# Patient Record
Sex: Female | Born: 1975 | Race: White | Hispanic: No | Marital: Married | Smoking: Former smoker
Health system: Southern US, Community
[De-identification: ages and names within clinical notes are randomized; demographics above are authoritative.]

## PROBLEM LIST (undated history)

## (undated) DIAGNOSIS — IMO0002 Reserved for concepts with insufficient information to code with codable children: Secondary | ICD-10-CM

## (undated) DIAGNOSIS — R87619 Unspecified abnormal cytological findings in specimens from cervix uteri: Secondary | ICD-10-CM

## (undated) DIAGNOSIS — F319 Bipolar disorder, unspecified: Secondary | ICD-10-CM

## (undated) DIAGNOSIS — D569 Thalassemia, unspecified: Secondary | ICD-10-CM

## (undated) HISTORY — PX: CRYOTHERAPY: SHX1416

## (undated) HISTORY — DX: Unspecified abnormal cytological findings in specimens from cervix uteri: R87.619

## (undated) HISTORY — PX: COLPOSCOPY: SHX161

## (undated) HISTORY — DX: Bipolar disorder, unspecified: F31.9

## (undated) HISTORY — PX: DILATION AND CURETTAGE OF UTERUS: SHX78

## (undated) HISTORY — DX: Reserved for concepts with insufficient information to code with codable children: IMO0002

## (undated) HISTORY — DX: Thalassemia, unspecified: D56.9

---

## 1998-08-06 ENCOUNTER — Emergency Department (HOSPITAL_COMMUNITY): Admission: EM | Admit: 1998-08-06 | Discharge: 1998-08-06 | Payer: Self-pay | Admitting: Emergency Medicine

## 1999-09-25 ENCOUNTER — Other Ambulatory Visit: Admission: RE | Admit: 1999-09-25 | Discharge: 1999-09-25 | Payer: Self-pay | Admitting: Obstetrics and Gynecology

## 2000-03-28 ENCOUNTER — Emergency Department (HOSPITAL_COMMUNITY): Admission: EM | Admit: 2000-03-28 | Discharge: 2000-03-29 | Payer: Self-pay | Admitting: Emergency Medicine

## 2000-03-29 ENCOUNTER — Encounter: Payer: Self-pay | Admitting: Emergency Medicine

## 2000-10-06 ENCOUNTER — Other Ambulatory Visit: Admission: RE | Admit: 2000-10-06 | Discharge: 2000-10-06 | Payer: Self-pay | Admitting: Obstetrics and Gynecology

## 2000-11-03 ENCOUNTER — Emergency Department (HOSPITAL_COMMUNITY): Admission: EM | Admit: 2000-11-03 | Discharge: 2000-11-04 | Payer: Self-pay | Admitting: Emergency Medicine

## 2000-11-04 ENCOUNTER — Encounter: Payer: Self-pay | Admitting: Emergency Medicine

## 2000-12-21 ENCOUNTER — Encounter: Payer: Self-pay | Admitting: Gynecology

## 2000-12-21 ENCOUNTER — Inpatient Hospital Stay: Admission: AD | Admit: 2000-12-21 | Discharge: 2000-12-21 | Payer: Self-pay | Admitting: Gynecology

## 2000-12-23 ENCOUNTER — Encounter (INDEPENDENT_AMBULATORY_CARE_PROVIDER_SITE_OTHER): Payer: Self-pay | Admitting: Specialist

## 2000-12-23 ENCOUNTER — Ambulatory Visit (HOSPITAL_COMMUNITY): Admission: RE | Admit: 2000-12-23 | Discharge: 2000-12-23 | Payer: Self-pay | Admitting: *Deleted

## 2003-03-30 ENCOUNTER — Emergency Department (HOSPITAL_COMMUNITY): Admission: EM | Admit: 2003-03-30 | Discharge: 2003-03-31 | Payer: Self-pay | Admitting: Emergency Medicine

## 2003-04-04 ENCOUNTER — Ambulatory Visit (HOSPITAL_COMMUNITY): Admission: RE | Admit: 2003-04-04 | Discharge: 2003-04-04 | Payer: Self-pay | Admitting: Cardiology

## 2004-03-11 ENCOUNTER — Ambulatory Visit (HOSPITAL_COMMUNITY): Admission: RE | Admit: 2004-03-11 | Discharge: 2004-03-11 | Payer: Self-pay | Admitting: Family Medicine

## 2004-03-15 ENCOUNTER — Emergency Department (HOSPITAL_COMMUNITY): Admission: EM | Admit: 2004-03-15 | Discharge: 2004-03-15 | Payer: Self-pay | Admitting: Emergency Medicine

## 2004-03-18 ENCOUNTER — Emergency Department (HOSPITAL_COMMUNITY): Admission: EM | Admit: 2004-03-18 | Discharge: 2004-03-19 | Payer: Self-pay | Admitting: Emergency Medicine

## 2004-03-20 ENCOUNTER — Encounter: Admission: RE | Admit: 2004-03-20 | Discharge: 2004-03-20 | Payer: Self-pay | Admitting: Diagnostic Radiology

## 2004-03-30 ENCOUNTER — Ambulatory Visit (HOSPITAL_COMMUNITY): Admission: RE | Admit: 2004-03-30 | Discharge: 2004-03-30 | Payer: Self-pay | Admitting: Neurosurgery

## 2005-05-15 ENCOUNTER — Emergency Department (HOSPITAL_COMMUNITY): Admission: EM | Admit: 2005-05-15 | Discharge: 2005-05-15 | Payer: Self-pay | Admitting: Emergency Medicine

## 2005-10-24 ENCOUNTER — Emergency Department (HOSPITAL_COMMUNITY): Admission: EM | Admit: 2005-10-24 | Discharge: 2005-10-24 | Payer: Self-pay | Admitting: Emergency Medicine

## 2006-03-08 ENCOUNTER — Encounter: Admission: RE | Admit: 2006-03-08 | Discharge: 2006-03-08 | Payer: Self-pay | Admitting: Family Medicine

## 2006-05-16 ENCOUNTER — Ambulatory Visit: Payer: Self-pay | Admitting: Gynecology

## 2006-05-16 ENCOUNTER — Encounter (INDEPENDENT_AMBULATORY_CARE_PROVIDER_SITE_OTHER): Payer: Self-pay | Admitting: Specialist

## 2007-05-17 ENCOUNTER — Encounter (INDEPENDENT_AMBULATORY_CARE_PROVIDER_SITE_OTHER): Payer: Self-pay | Admitting: Gynecology

## 2007-05-17 ENCOUNTER — Ambulatory Visit: Payer: Self-pay | Admitting: Gynecology

## 2008-05-20 ENCOUNTER — Encounter: Payer: Self-pay | Admitting: Obstetrics and Gynecology

## 2008-05-20 ENCOUNTER — Ambulatory Visit: Payer: Self-pay | Admitting: Obstetrics and Gynecology

## 2008-05-21 ENCOUNTER — Encounter: Payer: Self-pay | Admitting: Family Medicine

## 2008-05-21 LAB — CONVERTED CEMR LAB
Trich, Wet Prep: NONE SEEN
Yeast Wet Prep HPF POC: NONE SEEN

## 2008-07-03 ENCOUNTER — Ambulatory Visit: Payer: Self-pay | Admitting: Obstetrics & Gynecology

## 2008-07-11 ENCOUNTER — Encounter: Admission: RE | Admit: 2008-07-11 | Discharge: 2008-07-11 | Payer: Self-pay | Admitting: Family Medicine

## 2008-07-18 ENCOUNTER — Ambulatory Visit: Payer: Self-pay | Admitting: Obstetrics and Gynecology

## 2008-08-01 ENCOUNTER — Ambulatory Visit: Payer: Self-pay | Admitting: Obstetrics and Gynecology

## 2008-10-08 ENCOUNTER — Ambulatory Visit: Payer: Self-pay | Admitting: Obstetrics & Gynecology

## 2009-02-17 ENCOUNTER — Ambulatory Visit: Payer: Self-pay | Admitting: Family Medicine

## 2009-07-07 ENCOUNTER — Ambulatory Visit: Payer: Self-pay | Admitting: Obstetrics and Gynecology

## 2009-12-19 ENCOUNTER — Encounter: Admission: RE | Admit: 2009-12-19 | Discharge: 2009-12-19 | Payer: Self-pay | Admitting: Family Medicine

## 2010-03-15 ENCOUNTER — Encounter: Payer: Self-pay | Admitting: Family Medicine

## 2010-07-07 NOTE — Assessment & Plan Note (Signed)
NAMELESBIA, OTTAWAY NO.:  [id number]   MEDICAL RECORD NO.:  [id number]          PATIENT TYPE:  POB   LOCATION:  [facility] at [facility]         FACILITY:  [facility]   PHYSICIAN:  Argentina Donovan, MD        DATE OF BIRTH:  09/19/1975   DATE OF SERVICE:  05/20/2008                                  CLINIC NOTE   HISTORY OF PRESENT ILLNESS:  The patient is a 35 year old Caucasian  female gravida 2, para 1-0-1-1 with a severe depression and multiple  psychotomimetic drugs over the past month.  She has been on [facility]  for some time and is very satisfied with it.  We will give her  prescription for that.  She is in for annual physical examination.   MEDICATIONS:  Medications she is on are listed in the chart.   ALLERGIES:  Allergies to SEPTRA   REVIEW OF SYSTEMS:  Negative with the exception of the severe depression  and a complaint of heavy whitish foul vaginal discharge for the past  month.   PHYSICAL EXAMINATION:  VITAL SIGNS:  The patient's blood pressure is  128/97 and her pulse is 94.  She weighs 222 pounds.  She is 5 feet 7  inches tall  GENERAL:  Well developed slightly obese white female, in no acute  distress.  HEENT:  Within normal limits.  LUNGS:  Clear to auscultation and percussion.  HEART:  No murmur.  Normal sinus rhythm.  NECK:  Supple.  Thyroid symmetrical with no masses.  BREASTS:  Symmetrical.  No dominant masses.  No nipple discharge.  No  supraclavicular or axillary nodes.  ABDOMEN:  Soft, flat, and nontender.  No masses.  No organomegaly.  No  CVA tenderness.  EXTREMITIES:  No edema.  No varicosities.  NEUROLOGIC:  DTRs within normal limits.  PELVIC :  Vagina, external genitalia is normal.  Introitus is marital.  BUS is within normal limits.  Vagina is clean and well rugated.  Cervix  clean, parous, and well epithelialized.  The uterus is anterior with  normal size, shape, and consistency.  The adnexa could not be palpated  because of habitus  of the patient.   IMPRESSION:  Normal gynecological examination.  Wet prep was taken  because of the patient's complain of the foul heavy white discharge and  Pap smear is done.  The patient will be contacted with the results.           ______________________________  Argentina Donovan, MD     PR/MEDQ  D:  05/20/2008  T:  05/21/2008  Job:  [id number]

## 2010-07-07 NOTE — Assessment & Plan Note (Signed)
Samantha Jennings, Samantha Jennings NO.:  [id number]   MEDICAL RECORD NO.:  [id number]          PATIENT TYPE:  POB   LOCATION:  [facility] at [facility]         FACILITY:  [facility]   PHYSICIAN:  Argentina Donovan, MD        DATE OF BIRTH:  1975-10-01   DATE OF SERVICE:  07/18/2008                                  CLINIC NOTE   The patient is a 35 year old, gravida 2, para 1-0-1-1, who had a Pap  smear in March with LSIL underwent colposcopy in Jul 03, 2008 which  revealed benign endocervical curettings with biopsies of the exocervix  showing 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  The patient is a nonsmoker and  therefore I feel that probably cryosurgery of the cervix may very well  be enough to refer her cervix back to normal.  I am going to get her  scheduled over [facility] to get that done in the near  future.  She has been told since she is on birth control pills that if  she has a period was scheduled to reschedule.  I have talked to her  about the possible complications of the procedure.  She is going to  premedicate herself with ibuprofen prior to coming in for the procedure.  The patient is allergic to Septra, but has no other medical allergies.  She takes several cycle of medic drop such as Depakote, Lamictal Ambien,  and has been on season ache for sometime.  Review of systems in the past  has been negative with exception of severe depression and physical  examination was done in the last visit was completely normal with the  exception of the cervix.   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   PLAN:  Cryosurgery.           ______________________________  Argentina Donovan, MD     PR/MEDQ  D:  07/18/2008  T:  07/19/2008  Job:  [id number]

## 2010-07-07 NOTE — Assessment & Plan Note (Signed)
NAME:  Samantha Jennings, MEINTS NO.:  [id number]   MEDICAL RECORD NO.:  [id number]          PATIENT TYPE:  POB   LOCATION:  [facility] at [facility]         FACILITY:  [facility]   PHYSICIAN:  Scheryl Darter, MD       DATE OF BIRTH:  [birth year]   DATE OF SERVICE:                                  CLINIC NOTE   The patient returns today to consult about her birth control pills.  She  has a 35 year old white female, gravida 2, para 1, abortus 1, last  menstrual period Jul 21, 2008, who was on [place] for contraception.  She has been on this for a while, and she has been on birth control  pills since delivery of her child 12 years ago.  Her psychiatrist has  suggested that treatment for bipolar disorder may make her birth control  pills ineffective.  She is currently taking Abilify 15 mg day,  Wellbutrin XL 300 mg a day, and Lamictal 200 mg a day.  She had  cryotherapy for CIN 2 in June.   She has an allergy to [facility] [place].   The patient is pleasant, has normal affect today.  We discussed issues  of management of her bipolar disorder versus maintaining effective birth  control and cycle control.  She has had effective contraception for 12  years on oral contraceptives despite being treated for bipolar disorder  over the years and she is having excellent cycle control.  I believe she  has exhibited good effectiveness of her current meds and would not like  to change her method unless she decides she wants to.  We did discuss  the possibility of using an IUD instead of birth control pills.  She is  reassured by our discussion and wants to continue with oral  contraceptive pills.  She is to return in 4 months for repeat Pap smear.      Scheryl Darter, MD     JA/MEDQ  D:  10/08/2008  T:  10/09/2008  Job:  [id number]

## 2010-07-10 NOTE — Op Note (Signed)
[facility]  Patient:    DEBORRAH, MABIN Visit Number: [id number] MRN: [id number]          Service Type: DSU Location: [facility] Attending Physician:  Wetzel Bjornstad Proc. Date: 12/23/00 Admit Date:  12/23/2000 Discharge Date: 12/23/2000                             Operative Report  PREOPERATIVE DIAGNOSIS:       11-week missed abortion.  POSTOPERATIVE DIAGNOSIS:      11-week missed abortion.  OPERATION:                    Suction dilatation and curettage.  SURGEON:                      Katy Fitch, M.D.  ASSISTANT:  ANESTHESIA:                   MAC.  ESTIMATED BLOOD LOSS:         50 cc.  URINE OUTPUT:                 50 cc.  FLUIDS:                       800 cc crystalloid.  COMPLICATIONS:                None.  SPECIMEN:                     Products of conception.  FINDINGS:                     11-week size uterus.  DESCRIPTION OF PROCEDURE:     The patient was taken to the operating room and placed in [place] stirrups.  The patient was given anesthesia by IV.  The patient was then prepped and draped in the usual sterile fashion.  A weighted speculum was placed in the vagina and a Sims retractor in the anterior portion of the vagina.  The cervix was then grasped with a single tooth tenaculum and injected at 3 and 9 oclock position with 2% lidocaine.  The uterus was then sounded to approximately 11 cm.  A urine bladder catheter was then placed into the bladder and approximately 50 cc was returned.  A #9 suction device was then placed into the uterus fundus and two passes of suction was performed.  A sharp curet was then used to remove the remaining tissue from the uterus until a gritty texture was noted in all four quadrants.  A final pass of the #9 suction catheter was performed.  The patient had minimal bleeding postprocedure. The single tooth tenaculum was removed along with the speculum. The patient was taken to the recovery  room in stable condition. Attending Physician:  Wetzel Bjornstad DD:  12/23/00 TD:  12/26/00 Job: [id number]

## 2010-08-04 ENCOUNTER — Ambulatory Visit: Payer: Self-pay | Admitting: Family Medicine

## 2010-11-10 ENCOUNTER — Emergency Department (HOSPITAL_COMMUNITY)
Admission: EM | Admit: 2010-11-10 | Discharge: 2010-11-10 | Disposition: A | Discharge status: Home / Self Care | Payer: 59 | Attending: Emergency Medicine | Admitting: Emergency Medicine

## 2010-11-10 ENCOUNTER — Emergency Department (HOSPITAL_COMMUNITY): Payer: 59

## 2010-11-10 DIAGNOSIS — F17200 Nicotine dependence, unspecified, uncomplicated: Secondary | ICD-10-CM | POA: Insufficient documentation

## 2010-11-10 DIAGNOSIS — K8020 Calculus of gallbladder without cholecystitis without obstruction: Secondary | ICD-10-CM | POA: Insufficient documentation

## 2010-11-10 DIAGNOSIS — N201 Calculus of ureter: Secondary | ICD-10-CM | POA: Insufficient documentation

## 2010-11-10 DIAGNOSIS — R109 Unspecified abdominal pain: Secondary | ICD-10-CM | POA: Insufficient documentation

## 2010-11-10 LAB — URINALYSIS, ROUTINE W REFLEX MICROSCOPIC
Bilirubin Urine: NEGATIVE
Glucose, UA: NEGATIVE mg/dL
Ketones, ur: NEGATIVE mg/dL
Specific Gravity, Urine: 1.026 (ref 1.005–1.030)
pH: 6.5 (ref 5.0–8.0)

## 2010-11-10 LAB — URINE MICROSCOPIC-ADD ON

## 2010-11-10 LAB — PREGNANCY, URINE: Preg Test, Ur: NEGATIVE

## 2010-12-19 ENCOUNTER — Emergency Department (HOSPITAL_COMMUNITY)
Admission: EM | Admit: 2010-12-19 | Discharge: 2010-12-19 | Disposition: A | Discharge status: Home / Self Care | Payer: 59 | Attending: Emergency Medicine | Admitting: Emergency Medicine

## 2010-12-19 DIAGNOSIS — R55 Syncope and collapse: Secondary | ICD-10-CM | POA: Insufficient documentation

## 2010-12-19 DIAGNOSIS — D569 Thalassemia, unspecified: Secondary | ICD-10-CM | POA: Insufficient documentation

## 2010-12-19 DIAGNOSIS — N200 Calculus of kidney: Secondary | ICD-10-CM | POA: Insufficient documentation

## 2010-12-19 LAB — URINE MICROSCOPIC-ADD ON

## 2010-12-19 LAB — BASIC METABOLIC PANEL
CO2: 25 mEq/L (ref 19–32)
Calcium: 10.8 mg/dL — ABNORMAL HIGH (ref 8.4–10.5)
Creatinine, Ser: 1.08 mg/dL (ref 0.50–1.10)
GFR calc non Af Amer: 67 mL/min — ABNORMAL LOW (ref >90)
Glucose, Bld: 96 mg/dL (ref 70–99)
Sodium: 138 mEq/L (ref 135–145)

## 2010-12-19 LAB — URINALYSIS, ROUTINE W REFLEX MICROSCOPIC
Glucose, UA: NEGATIVE mg/dL
Ketones, ur: NEGATIVE mg/dL
Protein, ur: NEGATIVE mg/dL
Urobilinogen, UA: 0.2 mg/dL (ref 0.0–1.0)

## 2010-12-21 LAB — URINE CULTURE

## 2011-04-13 ENCOUNTER — Encounter: Payer: Self-pay | Admitting: Obstetrics and Gynecology

## 2011-04-13 ENCOUNTER — Ambulatory Visit (INDEPENDENT_AMBULATORY_CARE_PROVIDER_SITE_OTHER): Payer: 59 | Admitting: Obstetrics and Gynecology

## 2011-04-13 VITALS — BP 133/91 | HR 77 | Ht 67.5 in | Wt 227.0 lb

## 2011-04-13 DIAGNOSIS — Z1272 Encounter for screening for malignant neoplasm of vagina: Secondary | ICD-10-CM

## 2011-04-13 DIAGNOSIS — Z01419 Encounter for gynecological examination (general) (routine) without abnormal findings: Secondary | ICD-10-CM

## 2011-04-13 MED ORDER — LEVONORGEST-ETH ESTRAD 91-DAY 0.15-0.03 &0.01 MG PO TABS: 1.0000 | ORAL_TABLET | Freq: Every day | ORAL | Status: DC

## 2011-04-13 NOTE — Progress Notes (Signed)
  Subjective:    Patient ID: Arlee Muslim, female    DOB: 08/03/1975, 37 y.o.   MRN: [id number]  HPI  36 yo G2P with LMP 01/18/2011 and BMI 35 presenting today for annual exam. Patient doing well withou complaints. She is using Seasonique for birth control and is happy with that method. She denies any pelvic pain, abnormal discharge or bleeding  Review of Systems  All other systems reviewed and are negative.       Objective:   Physical Exam GENERAL: Well-developed, well-nourished female in no acute distress.  HEENT: Normocephalic, atraumatic. Sclerae anicteric.  NECK: Supple. Normal thyroid.  LUNGS: Clear to auscultation bilaterally.  HEART: Regular rate and rhythm. BREASTS: Symmetric in size. No palpable masses or lymphadenopathy, skin changes, or nipple drainage. ABDOMEN: Soft, nontender, nondistended. No organomegaly. PELVIC: Normal external female genitalia. Vagina is pink and rugated.  Normal discharge. Normal appearing cervix. Uterus is normal in size. No adnexal mass or tenderness. EXTREMITIES: No cyanosis, clubbing, or edema, 2+ distal pulses.     Assessment & Plan:  36 yo here for annual exams - Pap smear performed - Refill on seasonique provided - patient will be contacted with any abnormal results

## 2012-01-16 ENCOUNTER — Encounter (HOSPITAL_COMMUNITY): Payer: Self-pay | Admitting: Emergency Medicine

## 2012-01-16 ENCOUNTER — Emergency Department (HOSPITAL_COMMUNITY)
Admission: EM | Admit: 2012-01-16 | Discharge: 2012-01-16 | Disposition: A | Discharge status: Home / Self Care | Payer: 59 | Attending: Emergency Medicine | Admitting: Emergency Medicine

## 2012-01-16 DIAGNOSIS — Y9389 Activity, other specified: Secondary | ICD-10-CM | POA: Insufficient documentation

## 2012-01-16 DIAGNOSIS — F319 Bipolar disorder, unspecified: Secondary | ICD-10-CM | POA: Insufficient documentation

## 2012-01-16 DIAGNOSIS — Z87891 Personal history of nicotine dependence: Secondary | ICD-10-CM | POA: Insufficient documentation

## 2012-01-16 DIAGNOSIS — H8110 Benign paroxysmal vertigo, unspecified ear: Secondary | ICD-10-CM | POA: Insufficient documentation

## 2012-01-16 DIAGNOSIS — R112 Nausea with vomiting, unspecified: Secondary | ICD-10-CM | POA: Insufficient documentation

## 2012-01-16 DIAGNOSIS — S139XXA Sprain of joints and ligaments of unspecified parts of neck, initial encounter: Secondary | ICD-10-CM | POA: Insufficient documentation

## 2012-01-16 DIAGNOSIS — R21 Rash and other nonspecific skin eruption: Secondary | ICD-10-CM | POA: Insufficient documentation

## 2012-01-16 DIAGNOSIS — N390 Urinary tract infection, site not specified: Secondary | ICD-10-CM | POA: Insufficient documentation

## 2012-01-16 DIAGNOSIS — Z79899 Other long term (current) drug therapy: Secondary | ICD-10-CM | POA: Insufficient documentation

## 2012-01-16 DIAGNOSIS — D569 Thalassemia, unspecified: Secondary | ICD-10-CM | POA: Insufficient documentation

## 2012-01-16 DIAGNOSIS — T148XXA Other injury of unspecified body region, initial encounter: Secondary | ICD-10-CM

## 2012-01-16 DIAGNOSIS — X58XXXA Exposure to other specified factors, initial encounter: Secondary | ICD-10-CM | POA: Insufficient documentation

## 2012-01-16 DIAGNOSIS — Y929 Unspecified place or not applicable: Secondary | ICD-10-CM | POA: Insufficient documentation

## 2012-01-16 LAB — COMPREHENSIVE METABOLIC PANEL
Alkaline Phosphatase: 65 U/L (ref 39–117)
BUN: 8 mg/dL (ref 6–23)
Chloride: 102 mEq/L (ref 96–112)
Creatinine, Ser: 0.71 mg/dL (ref 0.50–1.10)
GFR calc Af Amer: >90 mL/min (ref >90)
Glucose, Bld: 92 mg/dL (ref 70–99)
Potassium: 3.4 mEq/L — ABNORMAL LOW (ref 3.5–5.1)
Total Bilirubin: 0.5 mg/dL (ref 0.3–1.2)

## 2012-01-16 LAB — URINALYSIS, ROUTINE W REFLEX MICROSCOPIC
Ketones, ur: 40 mg/dL — AB
Nitrite: NEGATIVE
Protein, ur: NEGATIVE mg/dL
Urobilinogen, UA: 0.2 mg/dL (ref 0.0–1.0)

## 2012-01-16 LAB — CBC
HCT: 37.3 % (ref 36.0–46.0)
Hemoglobin: 12 g/dL (ref 12.0–15.0)
MCHC: 32.2 g/dL (ref 30.0–36.0)
MCV: 63.8 fL — ABNORMAL LOW (ref 78.0–100.0)
WBC: 10 10*3/uL (ref 4.0–10.5)

## 2012-01-16 MED ORDER — MECLIZINE HCL 25 MG PO TABS
25.0000 mg | ORAL_TABLET | Freq: Once | ORAL | Status: AC
  Administered 2012-01-16: 25 mg via ORAL
  Filled 2012-01-16: qty 1

## 2012-01-16 MED ORDER — MECLIZINE HCL 50 MG PO TABS: 25.0000 mg | ORAL_TABLET | Freq: Three times a day (TID) | ORAL | Status: DC | PRN

## 2012-01-16 MED ORDER — CEPHALEXIN 500 MG PO CAPS: 500.0000 mg | ORAL_CAPSULE | Freq: Four times a day (QID) | ORAL | Status: DC

## 2012-01-16 MED ORDER — CYCLOBENZAPRINE HCL 10 MG PO TABS: 10.0000 mg | ORAL_TABLET | Freq: Two times a day (BID) | ORAL | Status: DC | PRN

## 2012-01-16 MED ORDER — OXYCODONE-ACETAMINOPHEN 5-325 MG PO TABS
2.0000 | ORAL_TABLET | Freq: Once | ORAL | Status: AC
  Administered 2012-01-16: 2 via ORAL
  Filled 2012-01-16: qty 2

## 2012-01-16 MED ORDER — ONDANSETRON HCL 4 MG/2ML IJ SOLN
4.0000 mg | Freq: Once | INTRAMUSCULAR | Status: AC
  Administered 2012-01-16: 4 mg via INTRAVENOUS
  Filled 2012-01-16: qty 2

## 2012-01-16 MED ORDER — OXYCODONE-ACETAMINOPHEN 5-325 MG PO TABS: 1.0000 | ORAL_TABLET | Freq: Four times a day (QID) | ORAL | Status: DC | PRN

## 2012-01-16 MED ORDER — CYCLOBENZAPRINE HCL 10 MG PO TABS
10.0000 mg | ORAL_TABLET | Freq: Once | ORAL | Status: AC
Start: 2012-01-16 — End: 2012-01-16
  Administered 2012-01-16: 10 mg via ORAL
  Filled 2012-01-16: qty 1

## 2012-01-16 MED ORDER — SODIUM CHLORIDE 0.9 % IV BOLUS (SEPSIS)
1000.0000 mL | Freq: Once | INTRAVENOUS | Status: AC
  Administered 2012-01-16: 1000 mL via INTRAVENOUS

## 2012-01-16 NOTE — ED Provider Notes (Signed)
Medical screening examination/treatment/procedure(s) were performed by non-physician practitioner and as supervising physician I was immediately available for consultation/collaboration.   Adaleen Hulgan, MD 01/16/12 1541 

## 2012-01-16 NOTE — ED Provider Notes (Signed)
History     CSN: [id number]  Arrival date & time 01/16/12  4098   First MD Initiated Contact with Patient 01/16/12 1002      Chief Complaint  Patient presents with  . Emesis  . Dizziness    (Consider location/radiation/quality/duration/timing/severity/associated sxs/prior treatment) HPI Comments: 36 y/o female presents to the emergency department complaining of dizziness since Friday. Dizziness present when she is sitting or standing, relieved by laying down. States she feels as if the room is spinning around her. Admits to associated tinnitus, nausea and vomiting. Also complaining of left-sided neck tightness around her trapezius muscle. Rates pain 7/10. Moving her head to the right and left makes the pain worse. Denies heavy lifting or hard physical activity. In about 6 for the past 2 weeks due to an eyelid infection and rash. She went to her course of clindamycin for 10 days and is now currently on doxycycline. States the rash and eye swelling is markedly better. She went back to her primary care physician on Friday, he was not concerned for dizziness or the left-sided neck pain. He gave her Vicodin which is not providing any relief. Chest pain, shortness of breath, fever, chills, weakness or fatigue. Denies history of vertigo.  Patient is a 36 y.o. female presenting with vomiting. The history is provided by the patient and the spouse.  Emesis  Pertinent negatives include no abdominal pain, no chills, no diarrhea and no fever.    Past Medical History  Diagnosis Date  . Abnormal Pap smear     ASCUS  . Bipolar 1 disorder   . Thalassemia     Past Surgical History  Procedure Date  . Cryotherapy   . Colposcopy     Family History  Problem Relation Age of Onset  . Hypertension Mother   . Hypertension Sister   . Cancer Maternal Uncle     Prostate Cancer    History  Substance Use Topics  . Smoking status: Former Smoker -- 1.0 packs/day    Types: Cigarettes  . Smokeless  tobacco: Never Used     Comment: LESS THAN ONE PACK A DAY   . Alcohol Use: No    OB History    Grav Para Term Preterm Abortions TAB SAB Ect Mult Living   2 1              Review of Systems  Constitutional: Negative for fever, chills and appetite change.  HENT: Positive for neck pain.   Eyes: Negative for visual disturbance.  Respiratory: Negative for shortness of breath.   Cardiovascular: Negative for chest pain.  Gastrointestinal: Positive for nausea and vomiting. Negative for abdominal pain and diarrhea.  Genitourinary: Negative.   Musculoskeletal:       Positive for left sided neck pain  Skin: Positive for rash.  Neurological: Positive for dizziness. Negative for weakness.  Psychiatric/Behavioral: Negative for confusion.    Allergies  Septra  Home Medications   Current Outpatient Rx  Name  Route  Sig  Dispense  Refill  . ALPRAZOLAM 0.25 MG PO TABS   Oral   Take 0.25 mg by mouth at bedtime as needed.         . BUPROPION HCL ER (XL) 300 MG PO TB24   Oral   Take 300 mg by mouth daily.         Marland Kitchen LEVONORGEST-ETH ESTRAD 91-DAY 0.15-0.03 &0.01 MG PO TABS   Oral   Take 1 tablet by mouth daily.   1 Package  4   . ZOLPIDEM TARTRATE 10 MG PO TABS   Oral   Take 10 mg by mouth at bedtime as needed.           BP 133/89  Pulse 95  Temp 98.1 F (36.7 C) (Oral)  Resp 16  SpO2 100%  LMP 10/17/2011  Physical Exam  Nursing note and vitals reviewed. Constitutional: She is oriented to person, place, and time. She appears well-developed. No distress.       Overweight, laying flat on bed to avoid dizziness  HENT:  Head: Normocephalic and atraumatic.  Right Ear: Hearing, tympanic membrane, external ear and ear canal normal.  Left Ear: Hearing, tympanic membrane, external ear and ear canal normal.  Mouth/Throat: Uvula is midline and oropharynx is clear and moist.  Eyes: Conjunctivae normal and EOM are normal. Pupils are equal, round, and reactive to light.    Neck: Neck supple.    Cardiovascular: Normal rate, regular rhythm, normal heart sounds and intact distal pulses.   Pulmonary/Chest: Effort normal and breath sounds normal.  Abdominal: Soft. Normal appearance. There is no tenderness.  Musculoskeletal: She exhibits no edema.       Left shoulder: She exhibits normal range of motion.       Arms: Neurological: She is alert and oriented to person, place, and time. She has normal strength.       Unsteady gate  Skin: Skin is warm, dry and intact. No pallor.  Psychiatric: Her speech is normal and behavior is normal. Her mood appears anxious.    ED Course  Procedures (including critical care time)  Labs Reviewed  CBC - Abnormal; Notable for the following:    RBC 5.85 (*)     MCV 63.8 (*)     MCH 20.5 (*)     All other components within normal limits  COMPREHENSIVE METABOLIC PANEL - Abnormal; Notable for the following:    Potassium 3.4 (*)     All other components within normal limits  URINALYSIS, ROUTINE W REFLEX MICROSCOPIC - Abnormal; Notable for the following:    Color, Urine AMBER (*)  BIOCHEMICALS MAY BE AFFECTED BY COLOR   APPearance CLOUDY (*)     Ketones, ur 40 (*)     Leukocytes, UA MODERATE (*)     All other components within normal limits  URINE MICROSCOPIC-ADD ON - Abnormal; Notable for the following:    Squamous Epithelial / LPF MANY (*)     All other components within normal limits   No results found.   1. BPPV (benign paroxysmal positional vertigo)   2. Muscle strain   3. UTI (lower urinary tract infection)       MDM  36 y/o female with dizziness, nausea, vomiting and tinnitus. Meclizine relieved her dizziness. Nausea subsided with Zofran. Still having left sided neck tightness- will give flexeril and re-assess. Neuro exam unremarkable. 12:33 PM Neck pain improved with flexeril and percocet. Patient able to tolerate crackers and fluids without feeling nauseated. No concern for central vertigo. Dx BPPV. Also  has UTI. Will treat UTI with keflex. Antivert given for BPPV. Flexeril and percocet for neck pain. Return precautions discussed.       Trevor Mace, PA-C 01/16/12 1234

## 2012-01-16 NOTE — ED Notes (Signed)
Pt presents w/ 2 wk hx of eye problem, skin rash and currently taking second antibx for same. On Friday developed extreme dizziness, feels like her head weighs 500 lbs, neck and shoulder pain. Has not been able to retain any oral intake since Friday d/t nausea and emesis. Denies abd pain or diarrhea.

## 2012-01-16 NOTE — ED Notes (Addendum)
Reports started getting  dizziness & vomiting for 3 days starting Fri.. MD gave 10 days of Clindamycin due to eye lid infection completed on Mon.  Also, c/o rash started on Thurs.  white now red stated improving. Seen Fri. Due to rash & neck pain (started Fri.)  given new prescription for antibiotic & pain  vicodin did not help, tried icy hot massage to neck no relief.  Patient stated she feels better when laying flat when sitting up feels like the room is spinning. Now pain scale is 8/10.

## 2012-04-08 ENCOUNTER — Other Ambulatory Visit: Payer: Self-pay

## 2012-07-12 ENCOUNTER — Ambulatory Visit (INDEPENDENT_AMBULATORY_CARE_PROVIDER_SITE_OTHER): Payer: 59 | Admitting: Obstetrics and Gynecology

## 2012-07-12 ENCOUNTER — Encounter: Payer: Self-pay | Admitting: Obstetrics and Gynecology

## 2012-07-12 VITALS — BP 134/99 | HR 77 | Resp 16 | Ht 67.0 in | Wt 247.0 lb

## 2012-07-12 DIAGNOSIS — Z309 Encounter for contraceptive management, unspecified: Secondary | ICD-10-CM

## 2012-07-12 DIAGNOSIS — Z01419 Encounter for gynecological examination (general) (routine) without abnormal findings: Secondary | ICD-10-CM

## 2012-07-12 DIAGNOSIS — IMO0001 Reserved for inherently not codable concepts without codable children: Secondary | ICD-10-CM

## 2012-07-12 DIAGNOSIS — Z124 Encounter for screening for malignant neoplasm of cervix: Secondary | ICD-10-CM

## 2012-07-12 DIAGNOSIS — Z1151 Encounter for screening for human papillomavirus (HPV): Secondary | ICD-10-CM

## 2012-07-12 NOTE — Progress Notes (Signed)
  Subjective:     Samantha Jennings is a 37 y.o. female G2P1 with BMI 38 and LMP 04/28/2012 who is here for a comprehensive physical exam. The patient reports no problems. She is still using Seasonique for birth control and is happy with that choice  History   Social History  . Marital Status: Single    Spouse Name: N/A    Number of Children: N/A  . Years of Education: N/A   Occupational History  . Not on file.   Social History Main Topics  . Smoking status: Former Smoker -- 1.00 packs/day    Types: Cigarettes  . Smokeless tobacco: Never Used     Comment: LESS THAN ONE PACK A DAY   . Alcohol Use: No  . Drug Use: No  . Sexually Active: Yes -- Female partner(s)    Birth Control/ Protection: Pill   Other Topics Concern  . Not on file   Social History Narrative  . No narrative on file   Health Maintenance  Topic Date Due  . Tetanus/tdap  07/30/1996  . Influenza Vaccine  10/23/2012  . Pap Smear  04/12/2014   Past Medical History  Diagnosis Date  . Abnormal Pap smear     ASCUS  . Bipolar 1 disorder   . Thalassemia    Past Surgical History  Procedure Laterality Date  . Cryotherapy    . Colposcopy     Family History  Problem Relation Age of Onset  . Hypertension Mother   . Hypertension Sister   . Cancer Maternal Uncle     Prostate Cancer       Review of Systems A comprehensive review of systems was negative.   Objective:      GENERAL: Well-developed, well-nourished female in no acute distress.  HEENT: Normocephalic, atraumatic. Sclerae anicteric.  NECK: Supple. Normal thyroid.  LUNGS: Clear to auscultation bilaterally.  HEART: Regular rate and rhythm. BREASTS: Symmetric in size. No palpable masses or lymphadenopathy, skin changes, or nipple drainage. ABDOMEN: Soft, nontender, nondistended. No organomegaly. PELVIC: Normal external female genitalia. Vagina is pink and rugated.  Normal discharge. Normal appearing cervix. Uterus is normal in size. No adnexal  mass or tenderness. EXTREMITIES: No cyanosis, clubbing, or edema, 2+ distal pulses.    Assessment:    Healthy female exam.      Plan:    Pap smear collected Patient advised to exercise regularly Patient advised to continue monthly self breast and vulva exams Discussed elevated diastolic BP today and need to follow-up with PCP See After Visit Summary for Counseling Recommendations

## 2012-07-12 NOTE — Patient Instructions (Signed)
Preventive Care for Adults, Female A healthy lifestyle and preventive care can promote health and wellness. Preventive health guidelines for women include the following key practices.  A routine yearly physical is a good way to check with your caregiver about your health and preventive screening. It is a chance to share any concerns and updates on your health, and to receive a thorough exam.  Visit your dentist for a routine exam and preventive care every 6 months. Brush your teeth twice a day and floss once a day. Good oral hygiene prevents tooth decay and gum disease.  The frequency of eye exams is based on your age, health, family medical history, use of contact lenses, and other factors. Follow your caregiver's recommendations for frequency of eye exams.  Eat a healthy diet. Foods like vegetables, fruits, whole grains, low-fat dairy products, and lean protein foods contain the nutrients you need without too many calories. Decrease your intake of foods high in solid fats, added sugars, and salt. Eat the right amount of calories for you.Get information about a proper diet from your caregiver, if necessary.  Regular physical exercise is one of the most important things you can do for your health. Most adults should get at least 150 minutes of moderate-intensity exercise (any activity that increases your heart rate and causes you to sweat) each week. In addition, most adults need muscle-strengthening exercises on 2 or more days a week.  Maintain a healthy weight. The body mass index (BMI) is a screening tool to identify possible weight problems. It provides an estimate of body fat based on height and weight. Your caregiver can help determine your BMI, and can help you achieve or maintain a healthy weight.For adults 20 years and older:  A BMI below 18.5 is considered underweight.  A BMI of 18.5 to 24.9 is normal.  A BMI of 25 to 29.9 is considered overweight.  A BMI of 30 and above is  considered obese.  Maintain normal blood lipids and cholesterol levels by exercising and minimizing your intake of saturated fat. Eat a balanced diet with plenty of fruit and vegetables. Blood tests for lipids and cholesterol should begin at age 41 and be repeated every 5 years. If your lipid or cholesterol levels are high, you are over 50, or you are at high risk for heart disease, you may need your cholesterol levels checked more frequently.Ongoing high lipid and cholesterol levels should be treated with medicines if diet and exercise are not effective.  If you smoke, find out from your caregiver how to quit. If you do not use tobacco, do not start.  If you are pregnant, do not drink alcohol. If you are breastfeeding, be very cautious about drinking alcohol. If you are not pregnant and choose to drink alcohol, do not exceed 1 drink per day. One drink is considered to be 12 ounces (355 mL) of beer, 5 ounces (148 mL) of wine, or 1.5 ounces (44 mL) of liquor.  Avoid use of street drugs. Do not share needles with anyone. Ask for help if you need support or instructions about stopping the use of drugs.  High blood pressure causes heart disease and increases the risk of stroke. Your blood pressure should be checked at least every 1 to 2 years. Ongoing high blood pressure should be treated with medicines if weight loss and exercise are not effective.  If you are 65 to 35 years old, ask your caregiver if you should take aspirin to prevent strokes.  Diabetes  screening involves taking a blood sample to check your fasting blood sugar level. This should be done once every 3 years, after age 45, if you are within normal weight and without risk factors for diabetes. Testing should be considered at a younger age or be carried out more frequently if you are overweight and have at least 1 risk factor for diabetes.  Breast cancer screening is essential preventive care for women. You should practice "breast  self-awareness." This means understanding the normal appearance and feel of your breasts and may include breast self-examination. Any changes detected, no matter how small, should be reported to a caregiver. Women in their 20s and 30s should have a clinical breast exam (CBE) by a caregiver as part of a regular health exam every 1 to 3 years. After age 40, women should have a CBE every year. Starting at age 40, women should consider having a mammography (breast X-ray test) every year. Women who have a family history of breast cancer should talk to their caregiver about genetic screening. Women at a high risk of breast cancer should talk to their caregivers about having magnetic resonance imaging (MRI) and a mammography every year.  The Pap test is a screening test for cervical cancer. A Pap test can show cell changes on the cervix that might become cervical cancer if left untreated. A Pap test is a procedure in which cells are obtained and examined from the lower end of the uterus (cervix).  Women should have a Pap test starting at age 21.  Between ages 21 and 29, Pap tests should be repeated every 2 years.  Beginning at age 30, you should have a Pap test every 3 years as long as the past 3 Pap tests have been normal.  Some women have medical problems that increase the chance of getting cervical cancer. Talk to your caregiver about these problems. It is especially important to talk to your caregiver if a new problem develops soon after your last Pap test. In these cases, your caregiver may recommend more frequent screening and Pap tests.  The above recommendations are the same for women who have or have not gotten the vaccine for human papillomavirus (HPV).  If you had a hysterectomy for a problem that was not cancer or a condition that could lead to cancer, then you no longer need Pap tests. Even if you no longer need a Pap test, a regular exam is a good idea to make sure no other problems are  starting.  If you are between ages 65 and 70, and you have had normal Pap tests going back 10 years, you no longer need Pap tests. Even if you no longer need a Pap test, a regular exam is a good idea to make sure no other problems are starting.  If you have had past treatment for cervical cancer or a condition that could lead to cancer, you need Pap tests and screening for cancer for at least 20 years after your treatment.  If Pap tests have been discontinued, risk factors (such as a new sexual partner) need to be reassessed to determine if screening should be resumed.  The HPV test is an additional test that may be used for cervical cancer screening. The HPV test looks for the virus that can cause the cell changes on the cervix. The cells collected during the Pap test can be tested for HPV. The HPV test could be used to screen women aged 30 years and older, and should   be used in women of any age who have unclear Pap test results. After the age of 30, women should have HPV testing at the same frequency as a Pap test.  Colorectal cancer can be detected and often prevented. Most routine colorectal cancer screening begins at the age of 50 and continues through age 75. However, your caregiver may recommend screening at an earlier age if you have risk factors for colon cancer. On a yearly basis, your caregiver may provide home test kits to check for hidden blood in the stool. Use of a small camera at the end of a tube, to directly examine the colon (sigmoidoscopy or colonoscopy), can detect the earliest forms of colorectal cancer. Talk to your caregiver about this at age 50, when routine screening begins. Direct examination of the colon should be repeated every 5 to 10 years through age 75, unless early forms of pre-cancerous polyps or small growths are found.  Hepatitis C blood testing is recommended for all people born from 1945 through 1965 and any individual with known risks for hepatitis C.  Practice  safe sex. Use condoms and avoid high-risk sexual practices to reduce the spread of sexually transmitted infections (STIs). STIs include gonorrhea, chlamydia, syphilis, trichomonas, herpes, HPV, and human immunodeficiency virus (HIV). Herpes, HIV, and HPV are viral illnesses that have no cure. They can result in disability, cancer, and death. Sexually active women aged 25 and younger should be checked for chlamydia. Older women with new or multiple partners should also be tested for chlamydia. Testing for other STIs is recommended if you are sexually active and at increased risk.  Osteoporosis is a disease in which the bones lose minerals and strength with aging. This can result in serious bone fractures. The risk of osteoporosis can be identified using a bone density scan. Women ages 65 and over and women at risk for fractures or osteoporosis should discuss screening with their caregivers. Ask your caregiver whether you should take a calcium supplement or vitamin D to reduce the rate of osteoporosis.  Menopause can be associated with physical symptoms and risks. Hormone replacement therapy is available to decrease symptoms and risks. You should talk to your caregiver about whether hormone replacement therapy is right for you.  Use sunscreen with sun protection factor (SPF) of 30 or more. Apply sunscreen liberally and repeatedly throughout the day. You should seek shade when your shadow is shorter than you. Protect yourself by wearing long sleeves, pants, a wide-brimmed hat, and sunglasses year round, whenever you are outdoors.  Once a month, do a whole body skin exam, using a mirror to look at the skin on your back. Notify your caregiver of new moles, moles that have irregular borders, moles that are larger than a pencil eraser, or moles that have changed in shape or color.  Stay current with required immunizations.  Influenza. You need a dose every fall (or winter). The composition of the flu vaccine  changes each year, so being vaccinated once is not enough.  Pneumococcal polysaccharide. You need 1 to 2 doses if you smoke cigarettes or if you have certain chronic medical conditions. You need 1 dose at age 65 (or older) if you have never been vaccinated.  Tetanus, diphtheria, pertussis (Tdap, Td). Get 1 dose of Tdap vaccine if you are younger than age 65, are over 65 and have contact with an infant, are a healthcare worker, are pregnant, or simply want to be protected from whooping cough. After that, you need a Td   booster dose every 10 years. Consult your caregiver if you have not had at least 3 tetanus and diphtheria-containing shots sometime in your life or have a deep or dirty wound.  HPV. You need this vaccine if you are a woman age 22 or younger. The vaccine is given in 3 doses over 6 months.  Measles, mumps, rubella (MMR). You need at least 1 dose of MMR if you were born in 1957 or later. You may also need a second dose.  Meningococcal. If you are age 97 to 47 and a first-year college student living in a residence hall, or have one of several medical conditions, you need to get vaccinated against meningococcal disease. You may also need additional booster doses.  Zoster (shingles). If you are age 25 or older, you should get this vaccine.  Varicella (chickenpox). If you have never had chickenpox or you were vaccinated but received only 1 dose, talk to your caregiver to find out if you need this vaccine.  Hepatitis A. You need this vaccine if you have a specific risk factor for hepatitis A virus infection or you simply wish to be protected from this disease. The vaccine is usually given as 2 doses, 6 to 18 months apart.  Hepatitis B. You need this vaccine if you have a specific risk factor for hepatitis B virus infection or you simply wish to be protected from this disease. The vaccine is given in 3 doses, usually over 6 months. Preventive Services / Frequency Ages 24 to 43  Blood  pressure check.** / Every 1 to 2 years.  Lipid and cholesterol check.** / Every 5 years beginning at age 61.  Clinical breast exam.** / Every 3 years for women in their 72s and 30s.  Pap test.** / Every 2 years from ages 56 through 6. Every 3 years starting at age 67 through age 52 or 26 with a history of 3 consecutive normal Pap tests.  HPV screening.** / Every 3 years from ages 69 through ages 43 to 61 with a history of 3 consecutive normal Pap tests.  Hepatitis C blood test.** / For any individual with known risks for hepatitis C.  Skin self-exam. / Monthly.  Influenza immunization.** / Every year.  Pneumococcal polysaccharide immunization.** / 1 to 2 doses if you smoke cigarettes or if you have certain chronic medical conditions.  Tetanus, diphtheria, pertussis (Tdap, Td) immunization. / A one-time dose of Tdap vaccine. After that, you need a Td booster dose every 10 years.  HPV immunization. / 3 doses over 6 months, if you are 16 and younger.  Measles, mumps, rubella (MMR) immunization. / You need at least 1 dose of MMR if you were born in 1957 or later. You may also need a second dose.  Meningococcal immunization. / 1 dose if you are age 67 to 62 and a first-year college student living in a residence hall, or have one of several medical conditions, you need to get vaccinated against meningococcal disease. You may also need additional booster doses.  Varicella immunization.** / Consult your caregiver.  Hepatitis A immunization.** / Consult your caregiver. 2 doses, 6 to 18 months apart.  Hepatitis B immunization.** / Consult your caregiver. 3 doses usually over 6 months. ** Family history and personal history of risk and conditions may change your caregiver's recommendations. Document Released: 04/06/2001 Document Revised: 05/03/2011 Document Reviewed: 07/06/2010 [facility] Patient Information 2014 [place], [place].

## 2012-07-13 MED ORDER — LEVONORGEST-ETH ESTRAD 91-DAY 0.15-0.03 &0.01 MG PO TABS: 1.0000 | ORAL_TABLET | Freq: Every day | ORAL | Status: DC

## 2012-07-13 NOTE — Addendum Note (Signed)
Addended by: Barbara Cower on: 07/13/2012 09:45 AM   Modules accepted: Orders

## 2012-09-01 IMAGING — CT CT ABD-PELV W/O CM
1 of 2 series · 15 of 32 positions shown, 19 images · non-contrast
Comparison: 07/11/2008

CLINICAL DATA: 35-year-old female with left flank, abdominal and
pelvic pain.  History of renal calculi.

CT ABDOMEN AND PELVIS WITHOUT CONTRAST
TECHNIQUE: Multidetector CT imaging of the abdomen and pelvis was
performed following the standard protocol without intravenous
contrast.

[Series 2: abd/pel w/o · axial · non-contrast · 0.98mm/px · z∈[-781,-321]mm · 15 of 102 slices shown, 19 images]
[im 5/102  soft-tissue]
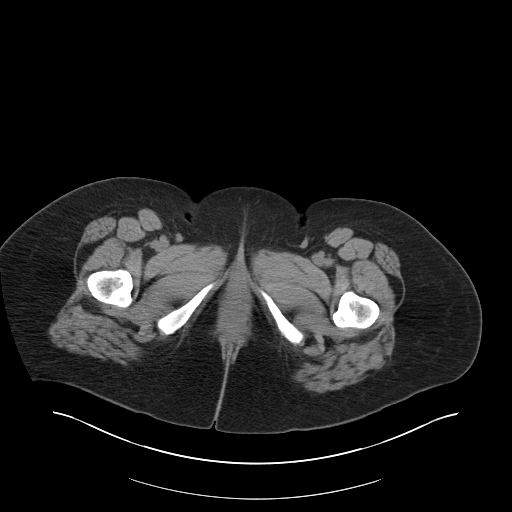
[im 5/102  bone]
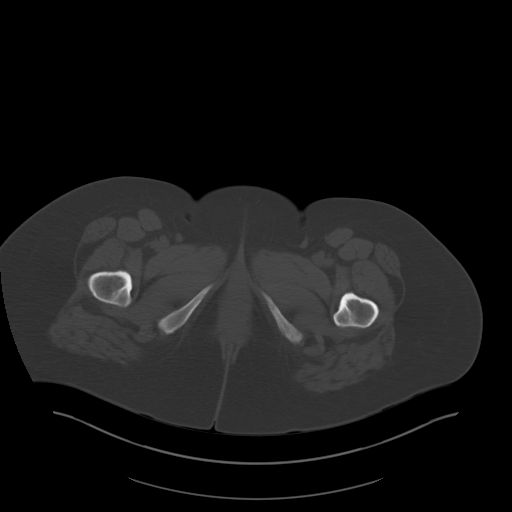
[im 13/102  soft-tissue]
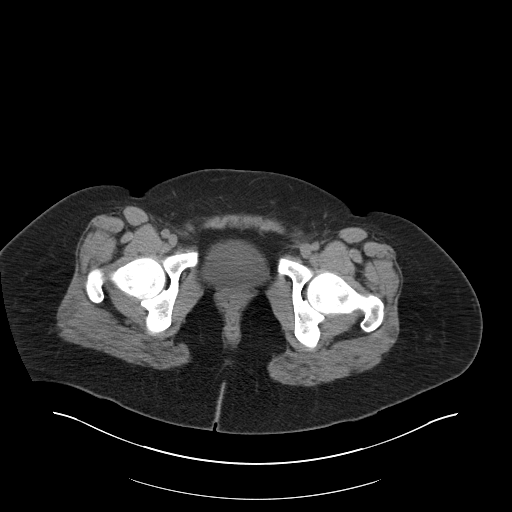
[im 22/102  soft-tissue]
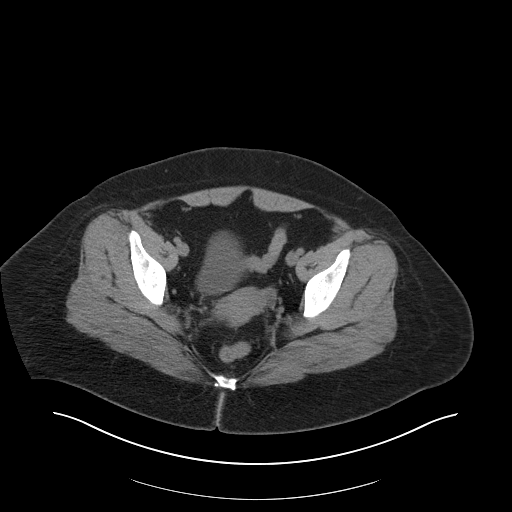
[im 30/102  soft-tissue]
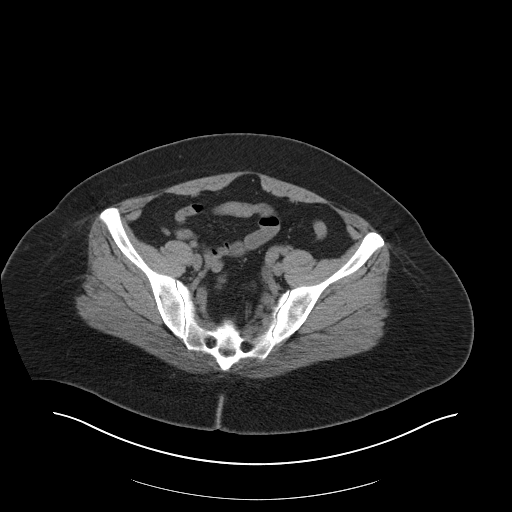
[im 34/102  soft-tissue]
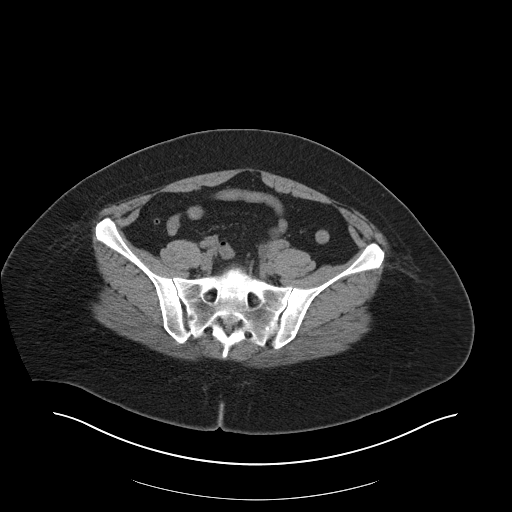
[im 43/102  soft-tissue]
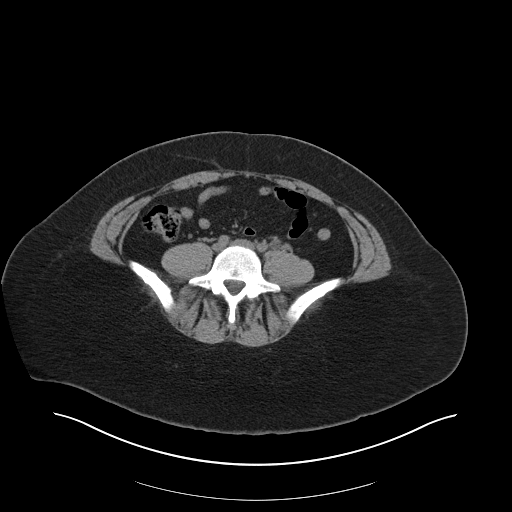
[im 51/102  soft-tissue]
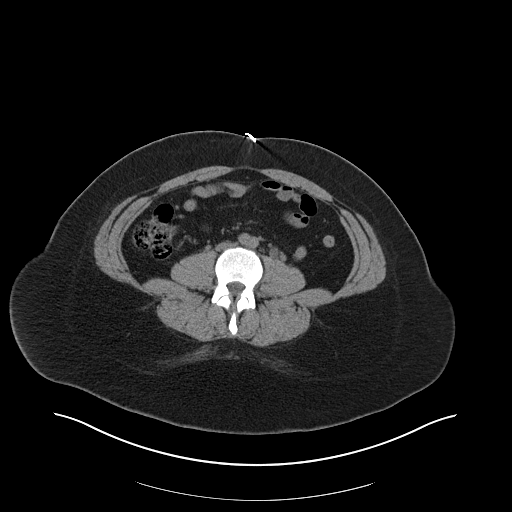
[im 59/102  soft-tissue]
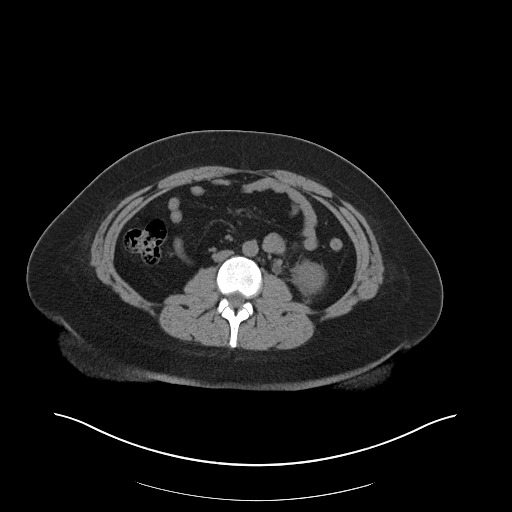
[im 68/102  soft-tissue]
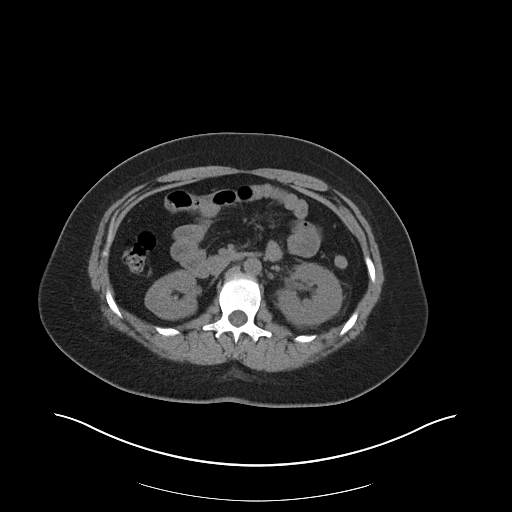
[im 68/102  bone]
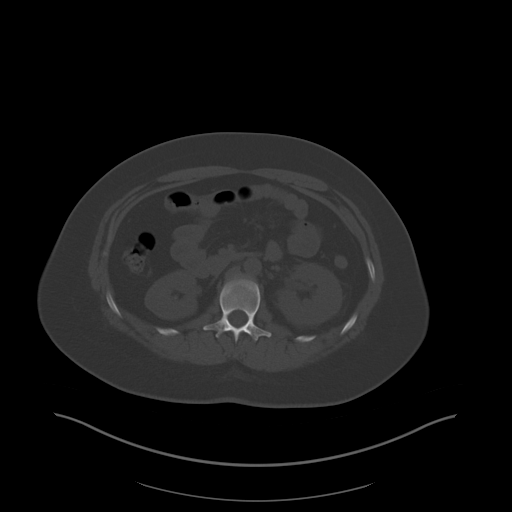
[im 72/102  soft-tissue]
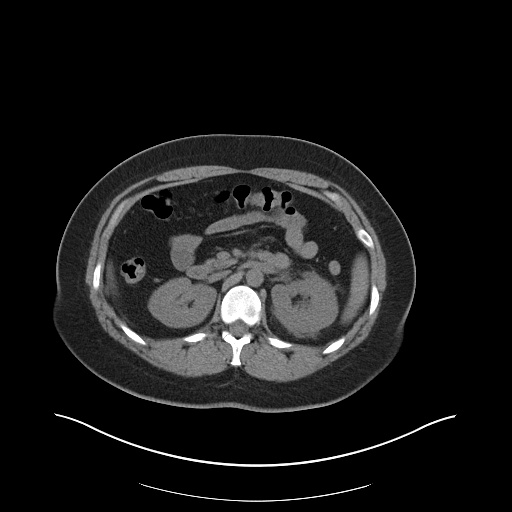
[im 80/102  soft-tissue]
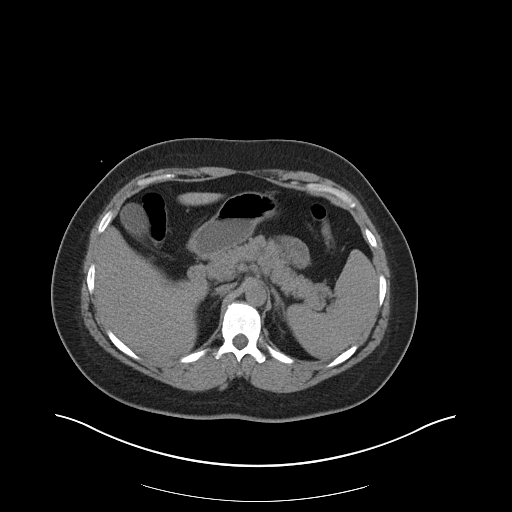
[im 85/102  lung]
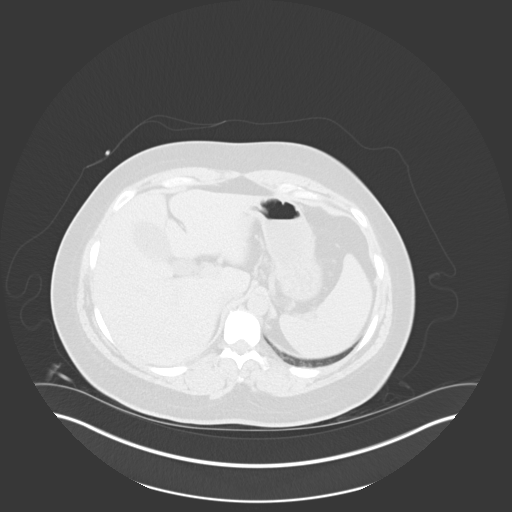
[im 89/102  soft-tissue]
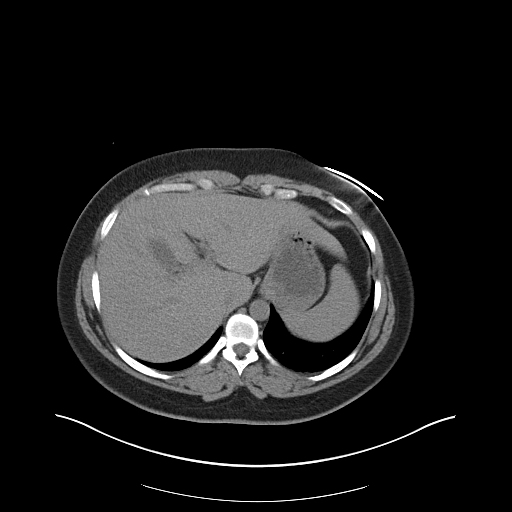
[im 89/102  lung]
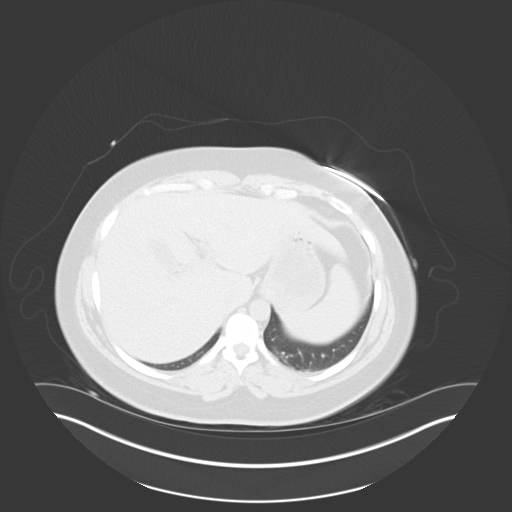
[im 93/102  lung]
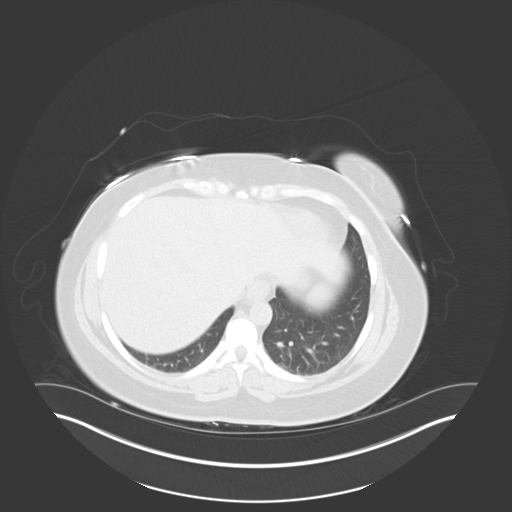
[im 97/102  soft-tissue]
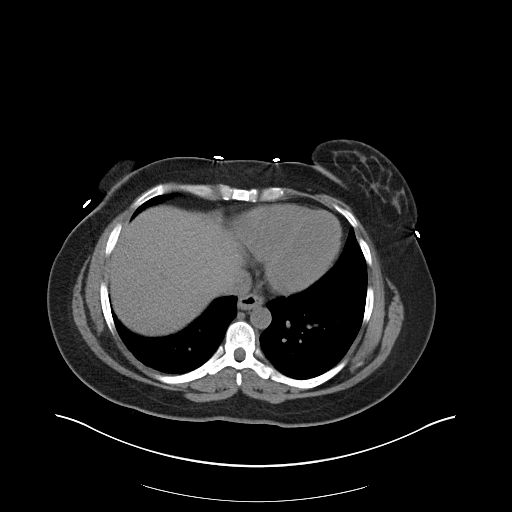
[im 97/102  lung]
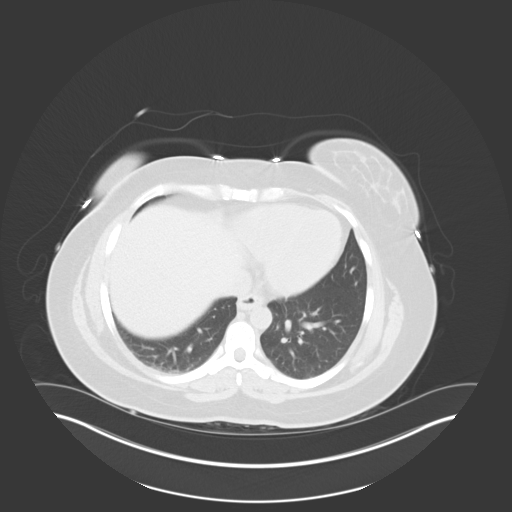

[15 of 32 positions shown; findings below may reference images not displayed]

FINDINGS: A 4 mm left UVJ calculus causes mild left
hydroureteronephrosis.
A punctate nonobstructing right lower pole renal calculus is
identified.
The liver, spleen, pancreas and adrenal glands are unremarkable.
Probable gallstones are identified.  There is no CT evidence of
acute cholecystitis.

Please note that parenchymal abnormalities may be missed as
intravenous contrast was not administered.
No free fluid, enlarged lymph nodes, biliary dilation or abdominal
aortic aneurysm identified.
The bowel and appendix are unremarkable.

No acute or suspicious bony abnormalities are identified.
IMPRESSION: 4 mm left UVJ calculus causing mild left hydroureteronephrosis.

Punctate nonobstructing right lower pole renal calculus.

Cholelithiasis.

## 2012-10-25 DIAGNOSIS — F411 Generalized anxiety disorder: Secondary | ICD-10-CM | POA: Insufficient documentation

## 2012-10-25 DIAGNOSIS — G4700 Insomnia, unspecified: Secondary | ICD-10-CM | POA: Insufficient documentation

## 2012-12-28 ENCOUNTER — Other Ambulatory Visit: Payer: Self-pay

## 2013-10-12 ENCOUNTER — Encounter: Payer: Self-pay | Admitting: Family Medicine

## 2013-10-12 ENCOUNTER — Ambulatory Visit (INDEPENDENT_AMBULATORY_CARE_PROVIDER_SITE_OTHER): Payer: 59 | Admitting: Family Medicine

## 2013-10-12 VITALS — BP 114/86 | HR 74 | Ht 66.0 in | Wt 240.0 lb

## 2013-10-12 DIAGNOSIS — Z124 Encounter for screening for malignant neoplasm of cervix: Secondary | ICD-10-CM

## 2013-10-12 DIAGNOSIS — D563 Thalassemia minor: Secondary | ICD-10-CM | POA: Insufficient documentation

## 2013-10-12 DIAGNOSIS — F319 Bipolar disorder, unspecified: Secondary | ICD-10-CM | POA: Insufficient documentation

## 2013-10-12 DIAGNOSIS — Z3040 Encounter for surveillance of contraceptives, unspecified: Secondary | ICD-10-CM

## 2013-10-12 DIAGNOSIS — Z01419 Encounter for gynecological examination (general) (routine) without abnormal findings: Secondary | ICD-10-CM

## 2013-10-12 MED ORDER — LEVONORGEST-ETH ESTRAD 91-DAY 0.15-0.03 &0.01 MG PO TABS: 1.0000 | ORAL_TABLET | Freq: Every day | ORAL | Status: DC

## 2013-10-12 NOTE — Progress Notes (Signed)
  Subjective:     Samantha Jennings is a 38 y.o. female and is here for a comprehensive physical exam. The patient reports no problems. Normal pap in 5/14.  Normal regular cycles on OC's.  Having medical labs done next week.  Psychiatrist maintains meds.  History   Social History  . Marital Status: Single    Spouse Name: N/A    Number of Children: N/A  . Years of Education: N/A   Occupational History  . Not on file.   Social History Main Topics  . Smoking status: Former Smoker -- 1.00 packs/day for 10 years    Types: Cigarettes    Quit date: 10/13/2003  . Smokeless tobacco: Never Used     Comment:    . Alcohol Use: No  . Drug Use: No  . Sexual Activity: Yes    Partners: Male    Birth Control/ Protection: Pill   Other Topics Concern  . Not on file   Social History Narrative  . No narrative on file   Health Maintenance  Topic Date Due  . Tetanus/tdap  07/30/1996  . Influenza Vaccine  09/22/2013  . Pap Smear  07/13/2015    The following portions of the patient's history were reviewed and updated as appropriate: allergies, current medications, past family history, past medical history, past social history, past surgical history and problem list.  Review of Systems A comprehensive review of systems was negative.   Objective:    BP 114/86  Pulse 74  Ht 5\' 6"  (1.676 m)  Wt 240 lb (108.863 kg)  BMI 38.76 kg/m2  LMP 07/15/2013 General appearance: alert, cooperative and appears stated age Head: Normocephalic, without obvious abnormality, atraumatic Neck: no adenopathy, supple, symmetrical, trachea midline and thyroid not enlarged, symmetric, no tenderness/mass/nodules Lungs: clear to auscultation bilaterally Breasts: normal appearance, no masses or tenderness Heart: regular rate and rhythm, S1, S2 normal, no murmur, click, rub or gallop Abdomen: soft, non-tender; bowel sounds normal; no masses,  no organomegaly Pelvic: cervix normal in appearance, external genitalia  normal, no adnexal masses or tenderness, no cervical motion tenderness, uterus normal size, shape, and consistency, vagina normal without discharge and shaved Extremities: extremities normal, atraumatic, no cyanosis or edema Pulses: 2+ and symmetric Skin: Skin color, texture, turgor normal. No rashes or lesions Lymph nodes: Cervical, supraclavicular, and axillary nodes normal. Neurologic: Grossly normal    Assessment:    Healthy female exam. Doing well      Plan:     Bipolar 1 disorder  Thalassemia minor  Screening for malignant neoplasm of the cervix  Routine gynecological examination  Encounter for surveillance of contraceptives - Plan: Levonorgestrel-Ethinyl Estradiol (AMETHIA,CAMRESE) 0.15-0.03 &0.01 MG tablet   See After Visit Summary for Counseling Recommendations

## 2013-10-12 NOTE — Patient Instructions (Signed)
Preventive Care for Adults A healthy lifestyle and preventive care can promote health and wellness. Preventive health guidelines for women include the following key practices.  A routine yearly physical is a good way to check with your health care provider about your health and preventive screening. It is a chance to share any concerns and updates on your health and to receive a thorough exam.  Visit your dentist for a routine exam and preventive care every 6 months. Brush your teeth twice a day and floss once a day. Good oral hygiene prevents tooth decay and gum disease.  The frequency of eye exams is based on your age, health, family medical history, use of contact lenses, and other factors. Follow your health care provider's recommendations for frequency of eye exams.  Eat a healthy diet. Foods like vegetables, fruits, whole grains, low-fat dairy products, and lean protein foods contain the nutrients you need without too many calories. Decrease your intake of foods high in solid fats, added sugars, and salt. Eat the right amount of calories for you.Get information about a proper diet from your health care provider, if necessary.  Regular physical exercise is one of the most important things you can do for your health. Most adults should get at least 150 minutes of moderate-intensity exercise (any activity that increases your heart rate and causes you to sweat) each week. In addition, most adults need muscle-strengthening exercises on 2 or more days a week.  Maintain a healthy weight. The body mass index (BMI) is a screening tool to identify possible weight problems. It provides an estimate of body fat based on height and weight. Your health care provider can find your BMI and can help you achieve or maintain a healthy weight.For adults 20 years and older:  A BMI below 18.5 is considered underweight.  A BMI of 18.5 to 24.9 is normal.  A BMI of 25 to 29.9 is considered overweight.  A BMI of  30 and above is considered obese.  Maintain normal blood lipids and cholesterol levels by exercising and minimizing your intake of saturated fat. Eat a balanced diet with plenty of fruit and vegetables. Blood tests for lipids and cholesterol should begin at age 76 and be repeated every 5 years. If your lipid or cholesterol levels are high, you are over 50, or you are at high risk for heart disease, you may need your cholesterol levels checked more frequently.Ongoing high lipid and cholesterol levels should be treated with medicines if diet and exercise are not working.  If you smoke, find out from your health care provider how to quit. If you do not use tobacco, do not start.  Lung cancer screening is recommended for adults aged 22-80 years who are at high risk for developing lung cancer because of a history of smoking. A yearly low-dose CT scan of the lungs is recommended for people who have at least a 30-pack-year history of smoking and are a current smoker or have quit within the past 15 years. A pack year of smoking is smoking an average of 1 pack of cigarettes a day for 1 year (for example: 1 pack a day for 30 years or 2 packs a day for 15 years). Yearly screening should continue until the smoker has stopped smoking for at least 15 years. Yearly screening should be stopped for people who develop a health problem that would prevent them from having lung cancer treatment.  If you are pregnant, do not drink alcohol. If you are breastfeeding,  be very cautious about drinking alcohol. If you are not pregnant and choose to drink alcohol, do not have more than 1 drink per day. One drink is considered to be 12 ounces (355 mL) of beer, 5 ounces (148 mL) of wine, or 1.5 ounces (44 mL) of liquor.  Avoid use of street drugs. Do not share needles with anyone. Ask for help if you need support or instructions about stopping the use of drugs.  High blood pressure causes heart disease and increases the risk of  stroke. Your blood pressure should be checked at least every 1 to 2 years. Ongoing high blood pressure should be treated with medicines if weight loss and exercise do not work.  If you are 75-52 years old, ask your health care provider if you should take aspirin to prevent strokes.  Diabetes screening involves taking a blood sample to check your fasting blood sugar level. This should be done once every 3 years, after age 15, if you are within normal weight and without risk factors for diabetes. Testing should be considered at a younger age or be carried out more frequently if you are overweight and have at least 1 risk factor for diabetes.  Breast cancer screening is essential preventive care for women. You should practice "breast self-awareness." This means understanding the normal appearance and feel of your breasts and may include breast self-examination. Any changes detected, no matter how small, should be reported to a health care provider. Women in their 58s and 30s should have a clinical breast exam (CBE) by a health care provider as part of a regular health exam every 1 to 3 years. After age 16, women should have a CBE every year. Starting at age 53, women should consider having a mammogram (breast X-ray test) every year. Women who have a family history of breast cancer should talk to their health care provider about genetic screening. Women at a high risk of breast cancer should talk to their health care providers about having an MRI and a mammogram every year.  Breast cancer gene (BRCA)-related cancer risk assessment is recommended for women who have family members with BRCA-related cancers. BRCA-related cancers include breast, ovarian, tubal, and peritoneal cancers. Having family members with these cancers may be associated with an increased risk for harmful changes (mutations) in the breast cancer genes BRCA1 and BRCA2. Results of the assessment will determine the need for genetic counseling and  BRCA1 and BRCA2 testing.  Routine pelvic exams to screen for cancer are no longer recommended for nonpregnant women who are considered low risk for cancer of the pelvic organs (ovaries, uterus, and vagina) and who do not have symptoms. Ask your health care provider if a screening pelvic exam is right for you.  If you have had past treatment for cervical cancer or a condition that could lead to cancer, you need Pap tests and screening for cancer for at least 20 years after your treatment. If Pap tests have been discontinued, your risk factors (such as having a new sexual partner) need to be reassessed to determine if screening should be resumed. Some women have medical problems that increase the chance of getting cervical cancer. In these cases, your health care provider may recommend more frequent screening and Pap tests.  The HPV test is an additional test that may be used for cervical cancer screening. The HPV test looks for the virus that can cause the cell changes on the cervix. The cells collected during the Pap test can be  tested for HPV. The HPV test could be used to screen women aged 30 years and older, and should be used in women of any age who have unclear Pap test results. After the age of 30, women should have HPV testing at the same frequency as a Pap test.  Colorectal cancer can be detected and often prevented. Most routine colorectal cancer screening begins at the age of 50 years and continues through age 75 years. However, your health care provider may recommend screening at an earlier age if you have risk factors for colon cancer. On a yearly basis, your health care provider may provide home test kits to check for hidden blood in the stool. Use of a small camera at the end of a tube, to directly examine the colon (sigmoidoscopy or colonoscopy), can detect the earliest forms of colorectal cancer. Talk to your health care provider about this at age 50, when routine screening begins. Direct  exam of the colon should be repeated every 5-10 years through age 75 years, unless early forms of pre-cancerous polyps or small growths are found.  People who are at an increased risk for hepatitis B should be screened for this virus. You are considered at high risk for hepatitis B if:  You were born in a country where hepatitis B occurs often. Talk with your health care provider about which countries are considered high risk.  Your parents were born in a high-risk country and you have not received a shot to protect against hepatitis B (hepatitis B vaccine).  You have HIV or AIDS.  You use needles to inject street drugs.  You live with, or have sex with, someone who has hepatitis B.  You get hemodialysis treatment.  You take certain medicines for conditions like cancer, organ transplantation, and autoimmune conditions.  Hepatitis C blood testing is recommended for all people born from 1945 through 1965 and any individual with known risks for hepatitis C.  Practice safe sex. Use condoms and avoid high-risk sexual practices to reduce the spread of sexually transmitted infections (STIs). STIs include gonorrhea, chlamydia, syphilis, trichomonas, herpes, HPV, and human immunodeficiency virus (HIV). Herpes, HIV, and HPV are viral illnesses that have no cure. They can result in disability, cancer, and death.  You should be screened for sexually transmitted illnesses (STIs) including gonorrhea and chlamydia if:  You are sexually active and are younger than 24 years.  You are older than 24 years and your health care provider tells you that you are at risk for this type of infection.  Your sexual activity has changed since you were last screened and you are at an increased risk for chlamydia or gonorrhea. Ask your health care provider if you are at risk.  If you are at risk of being infected with HIV, it is recommended that you take a prescription medicine daily to prevent HIV infection. This is  called preexposure prophylaxis (PrEP). You are considered at risk if:  You are a heterosexual woman, are sexually active, and are at increased risk for HIV infection.  You take drugs by injection.  You are sexually active with a partner who has HIV.  Talk with your health care provider about whether you are at high risk of being infected with HIV. If you choose to begin PrEP, you should first be tested for HIV. You should then be tested every 3 months for as long as you are taking PrEP.  Osteoporosis is a disease in which the bones lose minerals and strength   with aging. This can result in serious bone fractures or breaks. The risk of osteoporosis can be identified using a bone density scan. Women ages 65 years and over and women at risk for fractures or osteoporosis should discuss screening with their health care providers. Ask your health care provider whether you should take a calcium supplement or vitamin D to reduce the rate of osteoporosis.  Menopause can be associated with physical symptoms and risks. Hormone replacement therapy is available to decrease symptoms and risks. You should talk to your health care provider about whether hormone replacement therapy is right for you.  Use sunscreen. Apply sunscreen liberally and repeatedly throughout the day. You should seek shade when your shadow is shorter than you. Protect yourself by wearing long sleeves, pants, a wide-brimmed hat, and sunglasses year round, whenever you are outdoors.  Once a month, do a whole body skin exam, using a mirror to look at the skin on your back. Tell your health care provider of new moles, moles that have irregular borders, moles that are larger than a pencil eraser, or moles that have changed in shape or color.  Stay current with required vaccines (immunizations).  Influenza vaccine. All adults should be immunized every year.  Tetanus, diphtheria, and acellular pertussis (Td, Tdap) vaccine. Pregnant women should  receive 1 dose of Tdap vaccine during each pregnancy. The dose should be obtained regardless of the length of time since the last dose. Immunization is preferred during the 27th-36th week of gestation. An adult who has not previously received Tdap or who does not know her vaccine status should receive 1 dose of Tdap. This initial dose should be followed by tetanus and diphtheria toxoids (Td) booster doses every 10 years. Adults with an unknown or incomplete history of completing a 3-dose immunization series with Td-containing vaccines should begin or complete a primary immunization series including a Tdap dose. Adults should receive a Td booster every 10 years.  Varicella vaccine. An adult without evidence of immunity to varicella should receive 2 doses or a second dose if she has previously received 1 dose. Pregnant females who do not have evidence of immunity should receive the first dose after pregnancy. This first dose should be obtained before leaving the health care facility. The second dose should be obtained 4-8 weeks after the first dose.  Human papillomavirus (HPV) vaccine. Females aged 13-26 years who have not received the vaccine previously should obtain the 3-dose series. The vaccine is not recommended for use in pregnant females. However, pregnancy testing is not needed before receiving a dose. If a female is found to be pregnant after receiving a dose, no treatment is needed. In that case, the remaining doses should be delayed until after the pregnancy. Immunization is recommended for any person with an immunocompromised condition through the age of 26 years if she did not get any or all doses earlier. During the 3-dose series, the second dose should be obtained 4-8 weeks after the first dose. The third dose should be obtained 24 weeks after the first dose and 16 weeks after the second dose.  Zoster vaccine. One dose is recommended for adults aged 60 years or older unless certain conditions are  present.  Measles, mumps, and rubella (MMR) vaccine. Adults born before 1957 generally are considered immune to measles and mumps. Adults born in 1957 or later should have 1 or more doses of MMR vaccine unless there is a contraindication to the vaccine or there is laboratory evidence of immunity to   each of the three diseases. A routine second dose of MMR vaccine should be obtained at least 28 days after the first dose for students attending postsecondary schools, health care workers, or international travelers. People who received inactivated measles vaccine or an unknown type of measles vaccine during 1963-1967 should receive 2 doses of MMR vaccine. People who received inactivated mumps vaccine or an unknown type of mumps vaccine before 1979 and are at high risk for mumps infection should consider immunization with 2 doses of MMR vaccine. For females of childbearing age, rubella immunity should be determined. If there is no evidence of immunity, females who are not pregnant should be vaccinated. If there is no evidence of immunity, females who are pregnant should delay immunization until after pregnancy. Unvaccinated health care workers born before 1957 who lack laboratory evidence of measles, mumps, or rubella immunity or laboratory confirmation of disease should consider measles and mumps immunization with 2 doses of MMR vaccine or rubella immunization with 1 dose of MMR vaccine.  Pneumococcal 13-valent conjugate (PCV13) vaccine. When indicated, a person who is uncertain of her immunization history and has no record of immunization should receive the PCV13 vaccine. An adult aged 19 years or older who has certain medical conditions and has not been previously immunized should receive 1 dose of PCV13 vaccine. This PCV13 should be followed with a dose of pneumococcal polysaccharide (PPSV23) vaccine. The PPSV23 vaccine dose should be obtained at least 8 weeks after the dose of PCV13 vaccine. An adult aged 19  years or older who has certain medical conditions and previously received 1 or more doses of PPSV23 vaccine should receive 1 dose of PCV13. The PCV13 vaccine dose should be obtained 1 or more years after the last PPSV23 vaccine dose.  Pneumococcal polysaccharide (PPSV23) vaccine. When PCV13 is also indicated, PCV13 should be obtained first. All adults aged 65 years and older should be immunized. An adult younger than age 65 years who has certain medical conditions should be immunized. Any person who resides in a nursing home or long-term care facility should be immunized. An adult smoker should be immunized. People with an immunocompromised condition and certain other conditions should receive both PCV13 and PPSV23 vaccines. People with human immunodeficiency virus (HIV) infection should be immunized as soon as possible after diagnosis. Immunization during chemotherapy or radiation therapy should be avoided. Routine use of PPSV23 vaccine is not recommended for American Indians, [place] Natives, or people younger than 65 years unless there are medical conditions that require PPSV23 vaccine. When indicated, people who have unknown immunization and have no record of immunization should receive PPSV23 vaccine. One-time revaccination 5 years after the first dose of PPSV23 is recommended for people aged 19-64 years who have chronic kidney failure, nephrotic syndrome, asplenia, or immunocompromised conditions. People who received 1-2 doses of PPSV23 before age 65 years should receive another dose of PPSV23 vaccine at age 65 years or later if at least 5 years have passed since the previous dose. Doses of PPSV23 are not needed for people immunized with PPSV23 at or after age 65 years.  Meningococcal vaccine. Adults with asplenia or persistent complement component deficiencies should receive 2 doses of quadrivalent meningococcal conjugate (MenACWY-D) vaccine. The doses should be obtained at least 2 months apart.  Microbiologists working with certain meningococcal bacteria, military recruits, people at risk during an outbreak, and people who travel to or live in countries with a high rate of meningitis should be immunized. A first-year college student up through age   21 years who is living in a residence hall should receive a dose if she did not receive a dose on or after her 16th birthday. Adults who have certain high-risk conditions should receive one or more doses of vaccine.  Hepatitis A vaccine. Adults who wish to be protected from this disease, have certain high-risk conditions, work with hepatitis A-infected animals, work in hepatitis A research labs, or travel to or work in countries with a high rate of hepatitis A should be immunized. Adults who were previously unvaccinated and who anticipate close contact with an international adoptee during the first 60 days after arrival in the [place] from a country with a high rate of hepatitis A should be immunized.  Hepatitis B vaccine. Adults who wish to be protected from this disease, have certain high-risk conditions, may be exposed to blood or other infectious body fluids, are household contacts or sex partners of hepatitis B positive people, are clients or workers in certain care facilities, or travel to or work in countries with a high rate of hepatitis B should be immunized.  Haemophilus influenzae type b (Hib) vaccine. A previously unvaccinated person with asplenia or sickle cell disease or having a scheduled splenectomy should receive 1 dose of Hib vaccine. Regardless of previous immunization, a recipient of a hematopoietic stem cell transplant should receive a 3-dose series 6-12 months after her successful transplant. Hib vaccine is not recommended for adults with HIV infection. Preventive Services / Frequency Ages 64 to 68 years  Blood pressure check.** / Every 1 to 2 years.  Lipid and cholesterol check.** / Every 5 years beginning at age  22.  Clinical breast exam.** / Every 3 years for women in their 88s and 53s.  BRCA-related cancer risk assessment.** / For women who have family members with a BRCA-related cancer (breast, ovarian, tubal, or peritoneal cancers).  Pap test.** / Every 2 years from ages 90 through 51. Every 3 years starting at age 21 through age 56 or 3 with a history of 3 consecutive normal Pap tests.  HPV screening.** / Every 3 years from ages 24 through ages 1 to 46 with a history of 3 consecutive normal Pap tests.  Hepatitis C blood test.** / For any individual with known risks for hepatitis C.  Skin self-exam. / Monthly.  Influenza vaccine. / Every year.  Tetanus, diphtheria, and acellular pertussis (Tdap, Td) vaccine.** / Consult your health care provider. Pregnant women should receive 1 dose of Tdap vaccine during each pregnancy. 1 dose of Td every 10 years.  Varicella vaccine.** / Consult your health care provider. Pregnant females who do not have evidence of immunity should receive the first dose after pregnancy.  HPV vaccine. / 3 doses over 6 months, if 72 and younger. The vaccine is not recommended for use in pregnant females. However, pregnancy testing is not needed before receiving a dose.  Measles, mumps, rubella (MMR) vaccine.** / You need at least 1 dose of MMR if you were born in 1957 or later. You may also need a 2nd dose. For females of childbearing age, rubella immunity should be determined. If there is no evidence of immunity, females who are not pregnant should be vaccinated. If there is no evidence of immunity, females who are pregnant should delay immunization until after pregnancy.  Pneumococcal 13-valent conjugate (PCV13) vaccine.** / Consult your health care provider.  Pneumococcal polysaccharide (PPSV23) vaccine.** / 1 to 2 doses if you smoke cigarettes or if you have certain conditions.  Meningococcal vaccine.** /  1 dose if you are age 19 to 21 years and a first-year college  student living in a residence hall, or have one of several medical conditions, you need to get vaccinated against meningococcal disease. You may also need additional booster doses.  Hepatitis A vaccine.** / Consult your health care provider.  Hepatitis B vaccine.** / Consult your health care provider.  Haemophilus influenzae type b (Hib) vaccine.** / Consult your health care provider. Ages 40 to 64 years  Blood pressure check.** / Every 1 to 2 years.  Lipid and cholesterol check.** / Every 5 years beginning at age 20 years.  Lung cancer screening. / Every year if you are aged 55-80 years and have a 30-pack-year history of smoking and currently smoke or have quit within the past 15 years. Yearly screening is stopped once you have quit smoking for at least 15 years or develop a health problem that would prevent you from having lung cancer treatment.  Clinical breast exam.** / Every year after age 40 years.  BRCA-related cancer risk assessment.** / For women who have family members with a BRCA-related cancer (breast, ovarian, tubal, or peritoneal cancers).  Mammogram.** / Every year beginning at age 40 years and continuing for as long as you are in good health. Consult with your health care provider.  Pap test.** / Every 3 years starting at age 30 years through age 65 or 70 years with a history of 3 consecutive normal Pap tests.  HPV screening.** / Every 3 years from ages 30 years through ages 65 to 70 years with a history of 3 consecutive normal Pap tests.  Fecal occult blood test (FOBT) of stool. / Every year beginning at age 50 years and continuing until age 75 years. You may not need to do this test if you get a colonoscopy every 10 years.  Flexible sigmoidoscopy or colonoscopy.** / Every 5 years for a flexible sigmoidoscopy or every 10 years for a colonoscopy beginning at age 50 years and continuing until age 75 years.  Hepatitis C blood test.** / For all people born from 1945 through  1965 and any individual with known risks for hepatitis C.  Skin self-exam. / Monthly.  Influenza vaccine. / Every year.  Tetanus, diphtheria, and acellular pertussis (Tdap/Td) vaccine.** / Consult your health care provider. Pregnant women should receive 1 dose of Tdap vaccine during each pregnancy. 1 dose of Td every 10 years.  Varicella vaccine.** / Consult your health care provider. Pregnant females who do not have evidence of immunity should receive the first dose after pregnancy.  Zoster vaccine.** / 1 dose for adults aged 60 years or older.  Measles, mumps, rubella (MMR) vaccine.** / You need at least 1 dose of MMR if you were born in 1957 or later. You may also need a 2nd dose. For females of childbearing age, rubella immunity should be determined. If there is no evidence of immunity, females who are not pregnant should be vaccinated. If there is no evidence of immunity, females who are pregnant should delay immunization until after pregnancy.  Pneumococcal 13-valent conjugate (PCV13) vaccine.** / Consult your health care provider.  Pneumococcal polysaccharide (PPSV23) vaccine.** / 1 to 2 doses if you smoke cigarettes or if you have certain conditions.  Meningococcal vaccine.** / Consult your health care provider.  Hepatitis A vaccine.** / Consult your health care provider.  Hepatitis B vaccine.** / Consult your health care provider.  Haemophilus influenzae type b (Hib) vaccine.** / Consult your health care provider. Ages 65   years and over  Blood pressure check.** / Every 1 to 2 years.  Lipid and cholesterol check.** / Every 5 years beginning at age 22 years.  Lung cancer screening. / Every year if you are aged 73-80 years and have a 30-pack-year history of smoking and currently smoke or have quit within the past 15 years. Yearly screening is stopped once you have quit smoking for at least 15 years or develop a health problem that would prevent you from having lung cancer  treatment.  Clinical breast exam.** / Every year after age 4 years.  BRCA-related cancer risk assessment.** / For women who have family members with a BRCA-related cancer (breast, ovarian, tubal, or peritoneal cancers).  Mammogram.** / Every year beginning at age 40 years and continuing for as long as you are in good health. Consult with your health care provider.  Pap test.** / Every 3 years starting at age 9 years through age 34 or 91 years with 3 consecutive normal Pap tests. Testing can be stopped between 65 and 70 years with 3 consecutive normal Pap tests and no abnormal Pap or HPV tests in the past 10 years.  HPV screening.** / Every 3 years from ages 57 years through ages 64 or 45 years with a history of 3 consecutive normal Pap tests. Testing can be stopped between 65 and 70 years with 3 consecutive normal Pap tests and no abnormal Pap or HPV tests in the past 10 years.  Fecal occult blood test (FOBT) of stool. / Every year beginning at age 15 years and continuing until age 17 years. You may not need to do this test if you get a colonoscopy every 10 years.  Flexible sigmoidoscopy or colonoscopy.** / Every 5 years for a flexible sigmoidoscopy or every 10 years for a colonoscopy beginning at age 86 years and continuing until age 71 years.  Hepatitis C blood test.** / For all people born from [birth year] through 1963 and any individual with known risks for hepatitis C.  Osteoporosis screening.** / A one-time screening for women ages 83 years and over and women at risk for fractures or osteoporosis.  Skin self-exam. / Monthly.  Influenza vaccine. / Every year.  Tetanus, diphtheria, and acellular pertussis (Tdap/Td) vaccine.** / 1 dose of Td every 10 years.  Varicella vaccine.** / Consult your health care provider.  Zoster vaccine.** / 1 dose for adults aged 61 years or older.  Pneumococcal 13-valent conjugate (PCV13) vaccine.** / Consult your health care provider.  Pneumococcal  polysaccharide (PPSV23) vaccine.** / 1 dose for all adults aged 28 years and older.  Meningococcal vaccine.** / Consult your health care provider.  Hepatitis A vaccine.** / Consult your health care provider.  Hepatitis B vaccine.** / Consult your health care provider.  Haemophilus influenzae type b (Hib) vaccine.** / Consult your health care provider. ** Family history and personal history of risk and conditions may change your health care provider's recommendations. Document Released: 04/06/2001 Document Revised: 06/25/2013 Document Reviewed: 07/06/2010 [facility] Patient Information 2015 [place], [place]. This information is not intended to replace advice given to you by your health care provider. Make sure you discuss any questions you have with your health care provider.

## 2013-12-24 ENCOUNTER — Encounter: Payer: Self-pay | Admitting: Family Medicine

## 2014-01-01 ENCOUNTER — Emergency Department (HOSPITAL_COMMUNITY): Payer: 59

## 2014-01-01 ENCOUNTER — Encounter (HOSPITAL_COMMUNITY): Payer: Self-pay | Admitting: *Deleted

## 2014-01-01 ENCOUNTER — Emergency Department (HOSPITAL_COMMUNITY)
Admission: EM | Admit: 2014-01-01 | Discharge: 2014-01-01 | Disposition: A | Discharge status: Home / Self Care | Payer: 59 | Attending: Emergency Medicine | Admitting: Emergency Medicine

## 2014-01-01 DIAGNOSIS — Z79899 Other long term (current) drug therapy: Secondary | ICD-10-CM | POA: Diagnosis not present

## 2014-01-01 DIAGNOSIS — Y9389 Activity, other specified: Secondary | ICD-10-CM | POA: Diagnosis not present

## 2014-01-01 DIAGNOSIS — S0011XA Contusion of right eyelid and periocular area, initial encounter: Secondary | ICD-10-CM | POA: Diagnosis not present

## 2014-01-01 DIAGNOSIS — Y998 Other external cause status: Secondary | ICD-10-CM | POA: Diagnosis not present

## 2014-01-01 DIAGNOSIS — Y92012 Bathroom of single-family (private) house as the place of occurrence of the external cause: Secondary | ICD-10-CM | POA: Insufficient documentation

## 2014-01-01 DIAGNOSIS — D569 Thalassemia, unspecified: Secondary | ICD-10-CM | POA: Diagnosis not present

## 2014-01-01 DIAGNOSIS — W010XXA Fall on same level from slipping, tripping and stumbling without subsequent striking against object, initial encounter: Secondary | ICD-10-CM | POA: Diagnosis not present

## 2014-01-01 DIAGNOSIS — Z3202 Encounter for pregnancy test, result negative: Secondary | ICD-10-CM | POA: Insufficient documentation

## 2014-01-01 DIAGNOSIS — W19XXXA Unspecified fall, initial encounter: Secondary | ICD-10-CM

## 2014-01-01 DIAGNOSIS — W182XXA Fall in (into) shower or empty bathtub, initial encounter: Secondary | ICD-10-CM | POA: Diagnosis not present

## 2014-01-01 DIAGNOSIS — S0990XA Unspecified injury of head, initial encounter: Secondary | ICD-10-CM | POA: Insufficient documentation

## 2014-01-01 DIAGNOSIS — T148XXA Other injury of unspecified body region, initial encounter: Secondary | ICD-10-CM

## 2014-01-01 DIAGNOSIS — Z793 Long term (current) use of hormonal contraceptives: Secondary | ICD-10-CM | POA: Diagnosis not present

## 2014-01-01 DIAGNOSIS — Z8659 Personal history of other mental and behavioral disorders: Secondary | ICD-10-CM | POA: Diagnosis not present

## 2014-01-01 DIAGNOSIS — Z87891 Personal history of nicotine dependence: Secondary | ICD-10-CM | POA: Diagnosis not present

## 2014-01-01 DIAGNOSIS — S0033XA Contusion of nose, initial encounter: Secondary | ICD-10-CM | POA: Diagnosis not present

## 2014-01-01 LAB — URINALYSIS, ROUTINE W REFLEX MICROSCOPIC
Bilirubin Urine: NEGATIVE
GLUCOSE, UA: NEGATIVE mg/dL
Hgb urine dipstick: NEGATIVE
KETONES UR: NEGATIVE mg/dL
Nitrite: NEGATIVE
PROTEIN: NEGATIVE mg/dL
Specific Gravity, Urine: 1.023 (ref 1.005–1.030)
UROBILINOGEN UA: 0.2 mg/dL (ref 0.0–1.0)
pH: 6.5 (ref 5.0–8.0)

## 2014-01-01 LAB — COMPREHENSIVE METABOLIC PANEL
ALT: 25 U/L (ref 0–35)
AST: 17 U/L (ref 0–37)
Albumin: 3.5 g/dL (ref 3.5–5.2)
Alkaline Phosphatase: 83 U/L (ref 39–117)
Anion gap: 14 (ref 5–15)
BILIRUBIN TOTAL: 0.3 mg/dL (ref 0.3–1.2)
BUN: 11 mg/dL (ref 6–23)
CO2: 23 meq/L (ref 19–32)
CREATININE: 0.76 mg/dL (ref 0.50–1.10)
Calcium: 9.1 mg/dL (ref 8.4–10.5)
Chloride: 103 mEq/L (ref 96–112)
Glucose, Bld: 125 mg/dL — ABNORMAL HIGH (ref 70–99)
Potassium: 4 mEq/L (ref 3.7–5.3)
Sodium: 140 mEq/L (ref 137–147)
Total Protein: 7.4 g/dL (ref 6.0–8.3)

## 2014-01-01 LAB — CBC
HCT: 34.4 % — ABNORMAL LOW (ref 36.0–46.0)
Hemoglobin: 10.8 g/dL — ABNORMAL LOW (ref 12.0–15.0)
MCH: 20.3 pg — ABNORMAL LOW (ref 26.0–34.0)
MCHC: 31.4 g/dL (ref 30.0–36.0)
MCV: 64.5 fL — ABNORMAL LOW (ref 78.0–100.0)
PLATELETS: 298 10*3/uL (ref 150–400)
RBC: 5.33 MIL/uL — ABNORMAL HIGH (ref 3.87–5.11)
RDW: 15 % (ref 11.5–15.5)
WBC: 6.3 10*3/uL (ref 4.0–10.5)

## 2014-01-01 LAB — URINE MICROSCOPIC-ADD ON

## 2014-01-01 LAB — PREGNANCY, URINE: Preg Test, Ur: NEGATIVE

## 2014-01-01 MED ORDER — OXYCODONE-ACETAMINOPHEN 5-325 MG PO TABS: 1.0000 | ORAL_TABLET | ORAL | Status: DC | PRN

## 2014-01-01 MED ORDER — OXYCODONE-ACETAMINOPHEN 5-325 MG PO TABS
2.0000 | ORAL_TABLET | Freq: Once | ORAL | Status: AC
  Administered 2014-01-01: 2 via ORAL
  Filled 2014-01-01: qty 2

## 2014-01-01 NOTE — ED Provider Notes (Signed)
CSN: [id number]     Arrival date & time 01/01/14  1533 History   First MD Initiated Contact with Patient 01/01/14 1848     Chief Complaint  Patient presents with  . Eye Pain  . Facial Pain  . Blurred Vision     (Consider location/radiation/quality/duration/timing/severity/associated sxs/prior Treatment) The history is provided by the patient and medical records.    This is a 38 y.o. F with Bipolar disorder, thalassemia, presenting to the ED for head injury.  Patient states Saturday afternoon she was home alone and was taking a bubble bath.  States she drained the water out of the tub and attempted to get out when she slipped, impacting her right eye and cheek onto the sink.  State she is unsure of LOC, but remember opening her eyes and panicking.  Denies other injuries aside from a scratch on her left volar forearm.  States since incident she has been having intermittent headaches and blurred vision in her right eye.  States for the past 2 days she has felt somewhat "out of sorts" and episodes of near syncope.  States she has not fully passed out.  Denies further injuries, trauma, or falls.  States now she currently has a headache and pain surrounding her left eye, denies pain of the left eye itself.  Patient does not wear glasses or contact lenses.  Patient not currently on any type of anticoagulation.  No intervention tried PTA.  VS stable on arrival.  Past Medical History  Diagnosis Date  . Abnormal Pap smear     ASCUS  . Bipolar 1 disorder   . Thalassemia    Past Surgical History  Procedure Laterality Date  . Cryotherapy    . Colposcopy     Family History  Problem Relation Age of Onset  . Hypertension Mother   . Hypertension Sister   . Cancer Maternal Uncle     Prostate Cancer   History  Substance Use Topics  . Smoking status: Former Smoker -- 1.00 packs/day for 10 years    Types: Cigarettes    Quit date: 10/13/2003  . Smokeless tobacco: Never Used     Comment:    .  Alcohol Use: No   OB History    Gravida Para Term Preterm AB TAB SAB Ectopic Multiple Living   2 1   1  1   1      Review of Systems  Eyes: Positive for pain (orbit). Negative for photophobia.  Neurological: Positive for headaches.  All other systems reviewed and are negative.     Allergies  Sulfamethoxazole-trimethoprim and Septra  Home Medications   Prior to Admission medications   Medication Sig Start Date End Date Taking? Authorizing Provider  BRINTELLIX 20 MG TABS  09/18/13   Historical Provider, MD  clonazePAM (KLONOPIN) 0.5 MG tablet  09/18/13   Historical Provider, MD  Eszopiclone 3 MG TABS  09/18/13   Historical Provider, MD  Levonorgestrel-Ethinyl Estradiol (AMETHIA,CAMRESE) 0.15-0.03 &0.01 MG tablet Take 1 tablet by mouth daily. 10/12/13 01/11/15  Reva Boresanya S Pratt, MD  Multiple Vitamins-Minerals (MULTIVITAMIN WITH MINERALS) tablet Take 1 tablet by mouth daily.    Historical Provider, MD  omeprazole (PRILOSEC) 20 MG capsule  09/18/13   Historical Provider, MD  promethazine (PHENERGAN) 12.5 MG tablet  09/18/13   Historical Provider, MD  traMADol (ULTRAM) 50 MG tablet  09/20/13   Historical Provider, MD   BP 122/78 mmHg  Pulse 83  Temp(Src) 98.1 F (36.7 C) (Oral)  Resp 12  Ht 5\' 6"  (1.676 m)  Wt 224 lb (101.606 kg)  BMI 36.17 kg/m2  SpO2 100%  LMP 12/21/2013   Physical Exam  Constitutional: She is oriented to person, place, and time. She appears well-developed and well-nourished.  HENT:  Head: Normocephalic and atraumatic.  Right Ear: Tympanic membrane and ear canal normal.  Left Ear: Tympanic membrane and ear canal normal.  Nose: Sinus tenderness present. No nasal deformity or septal deviation. No epistaxis.  Mouth/Throat: Uvula is midline, oropharynx is clear and moist and mucous membranes are normal.  Tenderness, swelling, and bruising along right side of nose; no gross deformities; no septal hematoma or deviation; midface stable, dentition intact  Eyes:  Conjunctivae, EOM and lids are normal. Pupils are equal, round, and reactive to light.  Right eye with periorbital bruising and tenderness; EOMs intact without signs of entrapment; no conjunctival injection or hemorrhage; normal confrontation  Neck: Normal range of motion.  Cardiovascular: Normal rate, regular rhythm and normal heart sounds.   Pulmonary/Chest: Effort normal and breath sounds normal.  Abdominal: Soft. Bowel sounds are normal.  Musculoskeletal: Normal range of motion.  Neurological: She is alert and oriented to person, place, and time.  AAOx3, answering questions appropriately; equal strength UE and LE bilaterally; CN grossly intact; moves all extremities appropriately without ataxia; no focal neuro deficits or facial asymmetry appreciated  Skin: Skin is warm and dry.  Psychiatric: She has a normal mood and affect.  Speech clear and goal oriented  Nursing note and vitals reviewed.   ED Course  Procedures (including critical care time) Labs Review Labs Reviewed  CBC - Abnormal; Notable for the following:    RBC 5.33 (*)    Hemoglobin 10.8 (*)    HCT 34.4 (*)    MCV 64.5 (*)    MCH 20.3 (*)    All other components within normal limits  COMPREHENSIVE METABOLIC PANEL - Abnormal; Notable for the following:    Glucose, Bld 125 (*)    All other components within normal limits  URINALYSIS, ROUTINE W REFLEX MICROSCOPIC - Abnormal; Notable for the following:    Leukocytes, UA SMALL (*)    All other components within normal limits  URINE MICROSCOPIC-ADD ON - Abnormal; Notable for the following:    Squamous Epithelial / LPF FEW (*)    Bacteria, UA MANY (*)    All other components within normal limits  PREGNANCY, URINE  POC URINE PREG, ED    Imaging Review Ct Head Wo Contrast  01/01/2014   CLINICAL DATA:  Recent fall, headache.  Initial encounter.  EXAM: CT HEAD WITHOUT CONTRAST  TECHNIQUE: Contiguous axial images were obtained from the base of the skull through the  vertex without intravenous contrast.  COMPARISON:  03/18/2004  FINDINGS: A tiny hypodensity in the right occipital lobe may represent a small remote infarct/injury.  The gray-white differentiation is preserved.  There is no evidence of an acute infarct.  There is no acute intracranial hemorrhage.  There is no midline shift or mass effect.  There is no hydrocephalus.  There is no extra-axial fluid collection.  The paranasal sinuses and mastoid air cells are aerated.  The orbits are intact.  IMPRESSION: No acute intracranial process.   Electronically Signed   By: Fannie Knee   On: 01/01/2014 16:59   Ct Maxillofacial Wo Cm  01/01/2014   CLINICAL DATA:  Fall 4 days ago with injury to right high. Resulting blurred vision right eye.  EXAM: CT MAXILLOFACIAL WITHOUT CONTRAST  TECHNIQUE: Multidetector  CT imaging of the maxillofacial structures was performed. Multiplanar CT image reconstructions were also generated. A small metallic BB was placed on the right temple in order to reliably differentiate right from left.  COMPARISON:  Head CT 03/18/2004  FINDINGS: The orbits are normal and symmetric. Paranasal sinuses are well aerated. Mastoid air cells are clear. There is no acute fracture. No significant soft tissue injury. Remaining soft tissue structures are within normal.  IMPRESSION: No acute findings.   Electronically Signed   By: Elberta Fortisaniel  Boyle M.D.   On: 01/01/2014 19:51     EKG Interpretation None      MDM   Final diagnoses:  Fall  Closed head injury, initial encounter   38 year old female with fall and facial injury 3 days ago. On arrival, patient is alert and baseline oriented without focal neurologic deficit. She does have significant bruising and tenderness surrounding her right orbit, EOMs intact without signs of entrapment. She notes intermittent slurred speech, however speech is currently clear. Visual Acuity - Bilateral Near: 20/25 ; R Near: 20/40 ; L Near: 20/25, denies current visual  disturbance.  Lab work is reassuring.  U/a with many bacteria-- patient currently asx of this.  Will defer treatment pending urine culture.  CT head/maxillofacial was obtained, negative for ICH or facial fractures.  There was incidental findings of questionable hypodensity on right occipital lobe to suggest possible old injury vs infarct-- this was not area of impact, patient has no current pain in this area, and patient has no old head injuries or strokes to her knowledge. Hypodensity of unknown significance at this time, do not feel will change management today-- attending physician, Dr. Estell HarpinZammit in agreement with this.  Patient notes improvement of her pain after percocet.  Neurologic exam remains non-focal, speech clear  Patient and husband comfortable with discharge home.  Rx percocet. Husband will continue monitoring closely for any worsening symptoms.  I have encouraged FU with PCP.  Discussed plan with patient, he/she acknowledged understanding and agreed with plan of care.  Return precautions given for new or worsening symptoms  Garlon HatchetLisa M Wessley Emert, PA-C 01/01/14 2335  Benny LennertJoseph L Zammit, MD 01/02/14 [phone]

## 2014-01-01 NOTE — Discharge Instructions (Signed)
Take prescribed medication as directed for pain. May continue to have some soreness/pain for the next few days which is normal. Return to the ED for worsening symptoms--severe dizziness, confusion, etc.  Concussion A concussion is a brain injury. It is caused by:  A hit to the head.  A quick and sudden movement (jolt) of the head or neck. A concussion is usually not life threatening. Even so, it can cause serious problems. If you had a concussion before, you may have concussion-like problems after a hit to your head. HOME CARE General Instructions  Follow your doctor's directions carefully.  Take medicines only as told by your doctor.  Only take medicines your doctor says are safe.  Do not drink alcohol until your doctor says it is okay. Alcohol and some drugs can slow down healing. They can also put you at risk for further injury.  If you are having trouble remembering things, write them down.  Try to do one thing at a time if you get distracted easily. For example, do not watch TV while making dinner.  Talk to your family members or close friends when making important decisions.  Follow up with your doctor as told.  Watch your symptoms. Tell others to do the same. Serious problems can sometimes happen after a concussion. Older adults are more likely to have these problems.  Tell your teachers, school nurse, school counselor, coach, Event organiserathletic trainer, or work Production designer, theatre/television/filmmanager about your concussion. Tell them about what you can or cannot do. They should watch to see if:  It gets even harder for you to pay attention or concentrate.  It gets even harder for you to remember things or learn new things.  You need more time than normal to finish things.  You become annoyed (irritable) more than before.  You are not able to deal with stress as well.  You have more problems than before.  Rest. Make sure you:  Get plenty of sleep at night.  Go to sleep early.  Go to bed at the same  time every day. Try to wake up at the same time.  Rest during the day.  Take naps when you feel tired.  Limit activities where you have to think a lot or concentrate. These include:  Doing homework.  Doing work related to a job.  Watching TV.  Using the computer. Returning To Your Regular Activities Return to your normal activities slowly, not all at once. You must give your body and brain enough time to heal.   Do not play sports or do other athletic activities until your doctor says it is okay.  Ask your doctor when you can drive, ride a bicycle, or work other vehicles or machines. Never do these things if you feel dizzy.  Ask your doctor about when you can return to work or school. Preventing Another Concussion It is very important to avoid another brain injury, especially before you have healed. In rare cases, another injury can lead to permanent brain damage, brain swelling, or death. The risk of this is greatest during the first 7-10 days after your injury. Avoid injuries by:   Wearing a seat belt when riding in a car.  Not drinking too much alcohol.  Avoiding activities that could lead to a second concussion (such as contact sports).  Wearing a helmet when doing activities like:  Biking.  Skiing.  Skateboarding.  Skating.  Making your home safer by:  Removing things from the floor or stairways that could make  you trip.  Using grab bars in bathrooms and handrails by stairs.  Placing non-slip mats on floors and in bathtubs.  Improve lighting in dark areas. GET HELP IF:  It gets even harder for you to pay attention or concentrate.  It gets even harder for you to remember things or learn new things.  You need more time than normal to finish things.  You become annoyed (irritable) more than before.  You are not able to deal with stress as well.  You have more problems than before.  You have problems keeping your balance.  You are not able to react  quickly when you should. Get help if you have any of these problems for more than 2 weeks:   Lasting (chronic) headaches.  Dizziness or trouble balancing.  Feeling sick to your stomach (nausea).  Seeing (vision) problems.  Being affected by noises or light more than normal.  Feeling sad, low, down in the dumps, blue, gloomy, or empty (depressed).  Mood changes (mood swings).  Feeling of fear or nervousness about what may happen (anxiety).  Feeling annoyed.  Memory problems.  Problems concentrating or paying attention.  Sleep problems.  Feeling tired all the time. GET HELP RIGHT AWAY IF:   You have bad headaches or your headaches get worse.  You have weakness (even if it is in one hand, leg, or part of the face).  You have loss of feeling (numbness).  You feel off balance.  You keep throwing up (vomiting).  You feel tired.  One black center of your eye (pupil) is larger than the other.  You twitch or shake violently (convulse).  Your speech is not clear (slurred).  You are more confused, easily angered (agitated), or annoyed than before.  You have more trouble resting than before.  You are unable to recognize people or places.  You have neck pain.  It is difficult to wake you up.  You have unusual behavior changes.  You pass out (lose consciousness). MAKE SURE YOU:   Understand these instructions.  Will watch your condition.  Will get help right away if you are not doing well or get worse. Document Released: 01/27/2009 Document Revised: 06/25/2013 Document Reviewed: 08/31/2012 [facility] Patient Information 2015 [place]. This information is not intended to replace advice given to you by your health care provider. Make sure you discuss any questions you have with your health care provider.

## 2014-01-01 NOTE — ED Notes (Signed)
Pt. Slipped in bathtub and hit head on faucet; no loc. Rt. Periorbital pain, some blurred vision, and fainting spells, feels drunk last couple days, and some slurred speech.

## 2014-01-01 NOTE — ED Notes (Signed)
Pt in room changing and being hooked up to the monitor.

## 2014-01-01 NOTE — ED Notes (Signed)
Called Dr. Estell HarpinZammit regarding Ct results; acuity changed to 2

## 2014-12-13 ENCOUNTER — Ambulatory Visit (INDEPENDENT_AMBULATORY_CARE_PROVIDER_SITE_OTHER): Payer: Commercial Managed Care - HMO | Admitting: Certified Nurse Midwife

## 2014-12-13 ENCOUNTER — Encounter: Payer: Self-pay | Admitting: Certified Nurse Midwife

## 2014-12-13 VITALS — BP 111/73 | HR 76 | Ht 65.0 in | Wt 212.0 lb

## 2014-12-13 DIAGNOSIS — Z01419 Encounter for gynecological examination (general) (routine) without abnormal findings: Secondary | ICD-10-CM

## 2014-12-13 DIAGNOSIS — Z3040 Encounter for surveillance of contraceptives, unspecified: Secondary | ICD-10-CM

## 2014-12-13 DIAGNOSIS — Z124 Encounter for screening for malignant neoplasm of cervix: Secondary | ICD-10-CM | POA: Diagnosis not present

## 2014-12-13 DIAGNOSIS — Z1151 Encounter for screening for human papillomavirus (HPV): Secondary | ICD-10-CM

## 2014-12-13 DIAGNOSIS — Z3041 Encounter for surveillance of contraceptive pills: Secondary | ICD-10-CM | POA: Diagnosis not present

## 2014-12-13 MED ORDER — LEVONORGEST-ETH ESTRAD 91-DAY 0.15-0.03 &0.01 MG PO TABS: 1.0000 | ORAL_TABLET | Freq: Every day | ORAL | Status: AC

## 2014-12-13 NOTE — Patient Instructions (Signed)
Oral Contraception Use Oral contraceptive pills (OCPs) are medicines taken to prevent pregnancy. OCPs work by preventing the ovaries from releasing eggs. The hormones in OCPs also cause the cervical mucus to thicken, preventing the sperm from entering the uterus. The hormones also cause the uterine lining to become thin, not allowing a fertilized egg to attach to the inside of the uterus. OCPs are highly effective when taken exactly as prescribed. However, OCPs do not prevent sexually transmitted diseases (STDs). Safe sex practices, such as using condoms along with an OCP, can help prevent STDs. Before taking OCPs, you may have a physical exam and Pap test. Your health care provider may also order blood tests if necessary. Your health care provider will make sure you are a good candidate for oral contraception. Discuss with your health care provider the possible side effects of the OCP you may be prescribed. When starting an OCP, it can take 2 to 3 months for the body to adjust to the changes in hormone levels in your body.  HOW TO TAKE ORAL CONTRACEPTIVE PILLS Your health care provider may advise you on how to start taking the first cycle of OCPs. Otherwise, you can:   Start on day 1 of your menstrual period. You will not need any backup contraceptive protection with this start time.   Start on the first Sunday after your menstrual period or the day you get your prescription. In these cases, you will need to use backup contraceptive protection for the first week.   Start the pill at any time of your cycle. If you take the pill within 5 days of the start of your period, you are protected against pregnancy right away. In this case, you will not need a backup form of birth control. If you start at any other time of your menstrual cycle, you will need to use another form of birth control for 7 days. If your OCP is the type called a minipill, it will protect you from pregnancy after taking it for 2 days (48  hours). After you have started taking OCPs:   If you forget to take 1 pill, take it as soon as you remember. Take the next pill at the regular time.   If you miss 2 or more pills, call your health care provider because different pills have different instructions for missed doses. Use backup birth control until your next menstrual period starts.   If you use a 28-day pack that contains inactive pills and you miss 1 of the last 7 pills (pills with no hormones), it will not matter. Throw away the rest of the non-hormone pills and start a new pill pack.  No matter which day you start the OCP, you will always start a new pack on that same day of the week. Have an extra pack of OCPs and a backup contraceptive method available in case you miss some pills or lose your OCP pack.  HOME CARE INSTRUCTIONS   Do not smoke.   Always use a condom to protect against STDs. OCPs do not protect against STDs.   Use a calendar to mark your menstrual period days.   Read the information and directions that came with your OCP. Talk to your health care provider if you have questions.  SEEK MEDICAL CARE IF:   You develop nausea and vomiting.   You have abnormal vaginal discharge or bleeding.   You develop a rash.   You miss your menstrual period.   You are losing   your hair.   You need treatment for mood swings or depression.   You get dizzy when taking the OCP.   You develop acne from taking the OCP.   You become pregnant.  SEEK IMMEDIATE MEDICAL CARE IF:   You develop chest pain.   You develop shortness of breath.   You have an uncontrolled or severe headache.   You develop numbness or slurred speech.   You develop visual problems.   You develop pain, redness, and swelling in the legs.    This information is not intended to replace advice given to you by your health care provider. Make sure you discuss any questions you have with your health care provider.   Document  Released: 01/28/2011 Document Revised: 03/01/2014 Document Reviewed: 07/30/2012 [employer] Interactive Patient Education 2016 [employer]  

## 2014-12-13 NOTE — Progress Notes (Signed)
GYNECOLOGY CLINIC ANNUAL PREVENTATIVE CARE ENCOUNTER NOTE  Subjective:   Samantha Jennings is a 39 y.o. [id number] female here for a routine annual gynecologic exam.  Current complaints: none.   Denies abnormal vaginal bleeding, discharge, pelvic pain, problems with intercourse or other gynecologic concerns.    Gynecologic History Patient's last menstrual period was 10/24/2014. Contraception: OCP (estrogen/progesterone) Last Pap: 2014. Results were: normal   Obstetric History OB History  Gravida Para Term Preterm AB SAB TAB Ectopic Multiple Living  2 1   1 1    1     # Outcome Date GA Lbr Len/2nd Weight Sex Delivery Anes PTL Lv  2 Para     F Vag-Spont   Y  1 SAB               Past Medical History  Diagnosis Date  . Abnormal Pap smear     ASCUS  . Bipolar 1 disorder (HCC)   . Thalassemia     Past Surgical History  Procedure Laterality Date  . Cryotherapy    . Colposcopy      Current Outpatient Prescriptions on File Prior to Visit  Medication Sig Dispense Refill  . clonazePAM (KLONOPIN) 0.5 MG tablet Take 0.5 mg by mouth 2 (two) times daily.     . Levonorgestrel-Ethinyl Estradiol (AMETHIA,CAMRESE) 0.15-0.03 &0.01 MG tablet Take 1 tablet by mouth daily. 1 Package 4  . Multiple Vitamins-Minerals (MULTIVITAMIN WITH MINERALS) tablet Take 1 tablet by mouth daily.    Marland Kitchen. omeprazole (PRILOSEC) 20 MG capsule Take 20 mg by mouth daily.     . traZODone (DESYREL) 100 MG tablet Take 100 mg by mouth every evening.     No current facility-administered medications on file prior to visit.    Allergies  Allergen Reactions  . Sulfamethoxazole-Trimethoprim Anaphylaxis  . Septra [Bactrim] Swelling    Social History   Social History  . Marital Status: Single    Spouse Name: N/A  . Number of Children: N/A  . Years of Education: N/A   Occupational History  . Not on file.   Social History Main Topics  . Smoking status: Former Smoker -- 1.00 packs/day for 10 years    Types:  Cigarettes    Quit date: 10/13/2003  . Smokeless tobacco: Never Used     Comment:    . Alcohol Use: No  . Drug Use: No  . Sexual Activity:    Partners: Male    Birth Control/ Protection: Pill   Other Topics Concern  . Not on file   Social History Narrative    Family History  Problem Relation Age of Onset  . Hypertension Mother   . Hypertension Sister   . Cancer Maternal Uncle     Prostate Cancer    The following portions of the patient's history were reviewed and updated as appropriate: allergies, current medications, past family history, past medical history, past social history, past surgical history and problem list.  Review of Systems Pertinent items are noted in HPI.   Objective:  BP 111/73 mmHg  Pulse 76  Ht 5\' 5"  (1.651 m)  Wt 212 lb (96.163 kg)  BMI 35.28 kg/m2  LMP 10/24/2014 CONSTITUTIONAL: Well-developed, well-nourished female in no acute distress.  HENT:  Normocephalic, atraumatic, External right and left ear normal. Oropharynx is clear and moist EYES: Conjunctivae and EOM are normal. Pupils are equal, round, and reactive to light. No scleral icterus.  NECK: Normal range of motion, supple, no masses.  Normal thyroid.  SKIN:  Skin is warm and dry. No rash noted. Not diaphoretic. No erythema. No pallor. NEUROLGIC: Alert and oriented to person, place, and time. Normal reflexes, muscle tone coordination. No cranial nerve deficit noted. PSYCHIATRIC: Normal mood and affect. Normal behavior. Normal judgment and thought content. CARDIOVASCULAR: Normal heart rate noted, regular rhythm RESPIRATORY: Clear to auscultation bilaterally. Effort and breath sounds normal, no problems with respiration noted. BREASTS: Symmetric in size. No masses, skin changes, nipple drainage, or lymphadenopathy. ABDOMEN: Soft, normal bowel sounds, no distention noted.  No tenderness, rebound or guarding.  PELVIC: Normal appearing external genitalia; normal appearing vaginal mucosa and cervix.   No abnormal discharge noted.  Pap smear obtained.  Normal uterine size, no other palpable masses, no uterine or adnexal tenderness. MUSCULOSKELETAL: Normal range of motion. No tenderness.  No cyanosis, clubbing, or edema.  2+ distal pulses.   Assessment:  Annual gynecologic examination with pap smear   Plan:  Will follow up results of pap smear and manage accordingly. Seasonique RX  Routine preventative health maintenance measures emphasized. Please refer to After Visit Summary for other counseling recommendations.    Illene Bolus CNM [facility] [employer]

## 2014-12-17 LAB — CYTOLOGY - PAP

## 2017-09-23 ENCOUNTER — Emergency Department (HOSPITAL_COMMUNITY)
Admission: EM | Admit: 2017-09-23 | Discharge: 2017-09-23 | Disposition: A | Discharge status: Home / Self Care | Payer: BLUE CROSS/BLUE SHIELD | Attending: Emergency Medicine | Admitting: Emergency Medicine

## 2017-09-23 ENCOUNTER — Encounter (HOSPITAL_COMMUNITY): Payer: Self-pay

## 2017-09-23 ENCOUNTER — Other Ambulatory Visit: Payer: Self-pay

## 2017-09-23 DIAGNOSIS — R42 Dizziness and giddiness: Secondary | ICD-10-CM | POA: Insufficient documentation

## 2017-09-23 DIAGNOSIS — G2581 Restless legs syndrome: Secondary | ICD-10-CM | POA: Insufficient documentation

## 2017-09-23 DIAGNOSIS — R002 Palpitations: Secondary | ICD-10-CM | POA: Insufficient documentation

## 2017-09-23 DIAGNOSIS — Z3A08 8 weeks gestation of pregnancy: Secondary | ICD-10-CM | POA: Diagnosis not present

## 2017-09-23 DIAGNOSIS — Z87891 Personal history of nicotine dependence: Secondary | ICD-10-CM | POA: Diagnosis not present

## 2017-09-23 DIAGNOSIS — R251 Tremor, unspecified: Secondary | ICD-10-CM | POA: Diagnosis not present

## 2017-09-23 DIAGNOSIS — O9989 Other specified diseases and conditions complicating pregnancy, childbirth and the puerperium: Secondary | ICD-10-CM | POA: Diagnosis not present

## 2017-09-23 DIAGNOSIS — O219 Vomiting of pregnancy, unspecified: Secondary | ICD-10-CM | POA: Diagnosis not present

## 2017-09-23 DIAGNOSIS — R110 Nausea: Secondary | ICD-10-CM

## 2017-09-23 LAB — URINALYSIS, ROUTINE W REFLEX MICROSCOPIC
Bilirubin Urine: NEGATIVE
Glucose, UA: NEGATIVE mg/dL
Hgb urine dipstick: NEGATIVE
KETONES UR: 20 mg/dL — AB
Nitrite: NEGATIVE
PROTEIN: NEGATIVE mg/dL
Specific Gravity, Urine: 1.025 (ref 1.005–1.030)
pH: 6 (ref 5.0–8.0)

## 2017-09-23 LAB — COMPREHENSIVE METABOLIC PANEL
ALT: 26 U/L (ref 0–44)
AST: 26 U/L (ref 15–41)
Albumin: 4.3 g/dL (ref 3.5–5.0)
Alkaline Phosphatase: 82 U/L (ref 38–126)
Anion gap: 13 (ref 5–15)
BUN: 9 mg/dL (ref 6–20)
CO2: 23 mmol/L (ref 22–32)
Calcium: 9.3 mg/dL (ref 8.9–10.3)
Chloride: 105 mmol/L (ref 98–111)
Creatinine, Ser: 0.77 mg/dL (ref 0.44–1.00)
GFR calc Af Amer: >60 mL/min (ref >60)
GFR calc non Af Amer: >60 mL/min (ref >60)
GLUCOSE: 126 mg/dL — AB (ref 70–99)
Potassium: 3.3 mmol/L — ABNORMAL LOW (ref 3.5–5.1)
SODIUM: 141 mmol/L (ref 135–145)
Total Bilirubin: 0.8 mg/dL (ref 0.3–1.2)
Total Protein: 7.9 g/dL (ref 6.5–8.1)

## 2017-09-23 LAB — CBC
HCT: 36.9 % (ref 36.0–46.0)
HEMOGLOBIN: 11.9 g/dL — AB (ref 12.0–15.0)
MCH: 21.6 pg — ABNORMAL LOW (ref 26.0–34.0)
MCHC: 32.2 g/dL (ref 30.0–36.0)
MCV: 67 fL — ABNORMAL LOW (ref 78.0–100.0)
Platelets: 331 10*3/uL (ref 150–400)
RBC: 5.51 MIL/uL — AB (ref 3.87–5.11)
RDW: 17.6 % — ABNORMAL HIGH (ref 11.5–15.5)
WBC: 11.2 10*3/uL — ABNORMAL HIGH (ref 4.0–10.5)

## 2017-09-23 LAB — HCG, QUANTITATIVE, PREGNANCY: HCG, BETA CHAIN, QUANT, S: 3469 m[IU]/mL — AB (ref <5)

## 2017-09-23 LAB — LIPASE, BLOOD: LIPASE: 34 U/L (ref 11–51)

## 2017-09-23 MED ORDER — SODIUM CHLORIDE 0.9 % IV BOLUS
1000.0000 mL | Freq: Once | INTRAVENOUS | Status: AC
  Administered 2017-09-23: 1000 mL via INTRAVENOUS

## 2017-09-23 MED ORDER — DOXYLAMINE-PYRIDOXINE 10-10 MG PO TBEC: 1.0000 | DELAYED_RELEASE_TABLET | Freq: Three times a day (TID) | ORAL | 0 refills | Status: AC | PRN

## 2017-09-23 MED ORDER — ONDANSETRON HCL 4 MG/2ML IJ SOLN
4.0000 mg | Freq: Once | INTRAMUSCULAR | Status: AC | PRN
  Administered 2017-09-23: 4 mg via INTRAVENOUS
  Filled 2017-09-23: qty 2

## 2017-09-23 NOTE — ED Notes (Signed)
Bed: WA06 Expected date:  Expected time:  Means of arrival:  Comments: 42 yo stopped taking medications d/t pregnacy, c/o tremors

## 2017-09-23 NOTE — ED Provider Notes (Signed)
Emergency Department Provider Note   I have reviewed the triage vital signs and the nursing notes.   HISTORY  Chief Complaint Tremors and Medication Problem   HPI Samantha Jennings is a 42 y.o. female with PMH of Bipolar disorder and anxiety presents to the emergency department for evaluation of lightheadedness, tremors, nausea in the setting of abruptly discontinuing her psychiatric medication.  The patient was previously on Xanax 1 mg TID as well as Prozac, Latuda, and Trazadone.  The patient tested positive on a home pregnancy test and estimates that she is approximately [redacted] weeks pregnant.  She has OB follow-up next week.  She called her PCP office and spoke with someone at the front desk and advised that she stopped taking all these medications. Patient denies tapering. Last dose was 1 week prior but patient with significant worsening symptoms today. She reports vomiting and bad restless legs symptoms. Denies any lower abdominal pain, cramping, or vaginal bleeding. No fever or chills.    Past Medical History:  Diagnosis Date  . Abnormal Pap smear    ASCUS  . Bipolar 1 disorder (HCC)   . Thalassemia     Patient Active Problem List   Diagnosis Date Noted  . Bipolar 1 disorder (HCC) 10/12/2013  . Thalassemia minor 10/12/2013  . Anxiety, generalized 10/25/2012  . Insomnia 10/25/2012    Past Surgical History:  Procedure Laterality Date  . COLPOSCOPY    . CRYOTHERAPY    . DILATION AND CURETTAGE OF UTERUS     X2    Allergies Sulfamethoxazole-trimethoprim and Septra [bactrim]  Family History  Problem Relation Age of Onset  . Hypertension Mother   . Hypertension Sister   . Cancer Maternal Uncle        Prostate Cancer    Social History Social History   Tobacco Use  . Smoking status: Former Smoker    Packs/day: 1.00    Years: 10.00    Pack years: 10.00    Types: Cigarettes    Last attempt to quit: 10/13/2003    Years since quitting: 13.9  . Smokeless tobacco:  Never Used  . Tobacco comment:    Substance Use Topics  . Alcohol use: No  . Drug use: No    Review of Systems  Constitutional: No fever/chills. Positive fatigue and tremors.  Eyes: No visual changes. ENT: No sore throat. Cardiovascular: Denies chest pain. Respiratory: Denies shortness of breath. Gastrointestinal: No abdominal pain. Positive nausea and vomiting. No diarrhea. No constipation. Genitourinary: Negative for dysuria. Musculoskeletal: Negative for back pain. Skin: Negative for rash. Neurological: Negative for headaches, focal weakness or numbness.  10-point ROS otherwise negative.  ____________________________________________   PHYSICAL EXAM:  VITAL SIGNS: ED Triage Vitals [09/23/17 1935]  Enc Vitals Group     BP 124/87     Pulse Rate 70     Resp 18     Temp 98.2 F (36.8 C)     Temp Source Oral     SpO2 99 %     Weight 248 lb (112.5 kg)     Height 5\' 7"  (1.702 m)     Pain Score 0   Constitutional: Alert and oriented. Well appearing and in no acute distress. Patient does appear slightly tremulous and anxious but able to provide a full history.  Eyes: Conjunctivae are normal.  Head: Atraumatic. Nose: No congestion/rhinnorhea. Mouth/Throat: Mucous membranes are moist.   Neck: No stridor.  Cardiovascular: Normal rate, regular rhythm. Good peripheral circulation. Grossly normal heart sounds.  Respiratory: Normal respiratory effort.  No retractions. Lungs CTAB. Gastrointestinal: Soft and nontender. No distention.  Musculoskeletal: No lower extremity tenderness nor edema. No gross deformities of extremities. Neurologic:  Normal speech and language. No gross focal neurologic deficits are appreciated.  Skin:  Skin is warm, dry and intact. No rash noted.  ____________________________________________   LABS (all labs ordered are listed, but only abnormal results are displayed)  Labs Reviewed  COMPREHENSIVE METABOLIC PANEL - Abnormal; Notable for the  following components:      Result Value   Potassium 3.3 (*)    Glucose, Bld 126 (*)    All other components within normal limits  CBC - Abnormal; Notable for the following components:   WBC 11.2 (*)    RBC 5.51 (*)    Hemoglobin 11.9 (*)    MCV 67.0 (*)    MCH 21.6 (*)    RDW 17.6 (*)    All other components within normal limits  URINALYSIS, ROUTINE W REFLEX MICROSCOPIC - Abnormal; Notable for the following components:   Color, Urine AMBER (*)    APPearance HAZY (*)    Ketones, ur 20 (*)    Leukocytes, UA TRACE (*)    Bacteria, UA RARE (*)    All other components within normal limits  HCG, QUANTITATIVE, PREGNANCY - Abnormal; Notable for the following components:   hCG, Beta Chain, Quant, S 3,469 (*)    All other components within normal limits  LIPASE, BLOOD   ____________________________________________  EKG   EKG Interpretation  Date/Time:  Friday September 23 2017 20:59:04 EDT Ventricular Rate:  65 PR Interval:    QRS Duration: 98 QT Interval:  491 QTC Calculation: 511 R Axis:   6 Text Interpretation:  Sinus rhythm Low voltage, precordial leads RSR' in V1 or V2, right VCD or RVH Prolonged QT interval No STEMI.  Confirmed by Alona Bene [phone]) on 09/23/2017 9:21:36 PM       ____________________________________________  RADIOLOGY  None ____________________________________________   PROCEDURES  Procedure(s) performed:   Procedures  None ____________________________________________   INITIAL IMPRESSION / ASSESSMENT AND PLAN / ED COURSE  Pertinent labs & imaging results that were available during my care of the patient were reviewed by me and considered in my medical decision making (see chart for details).  Patient presents to the emergency department for evaluation of tremor, anxious feeling, weakness, nausea/vomiting in the setting of abruptly discontinuing multiple of her psychiatric medications including Xanax which she takes 3 times daily.  No  concern for withdrawal related seizure.  No hypertension here.  No abdominal pain or evidence of pregnancy complication.  Plan for IV fluids and Zofran.  Did discuss the very small risk of birth defects with Zofran but do not plan to discharge the patient home with this Lavarr President-term.  She has OB follow-up on Tuesday.   Patient feeling better after Zofran and IVF. Patient feeling better after taking her Xanax at home. Suspect symptoms are mild withdrawal symptoms from stopping meds. Patient last dose, prior to today, was 8 days ago. Doubt patient at risk for seizure at this point but advised she take the Xanax PRN until discussing further with PCP and OB. Has an appointment on Tuesday. Discharged home with Diclegis.   At this time, I do not feel there is any life-threatening condition present. I have reviewed and discussed all results (EKG, imaging, lab, urine as appropriate), exam findings with patient. I have reviewed nursing notes and appropriate previous records.  I feel the patient  is safe to be discharged home without further emergent workup. Discussed usual and customary return precautions. Patient and family (if present) verbalize understanding and are comfortable with this plan.  Patient will follow-up with their primary care provider. If they do not have a primary care provider, information for follow-up has been provided to them. All questions have been answered.  ____________________________________________  FINAL CLINICAL IMPRESSION(S) / ED DIAGNOSES  Final diagnoses:  Tremor  Palpitations  Nausea     MEDICATIONS GIVEN DURING THIS VISIT:  Medications  ondansetron (ZOFRAN) injection 4 mg (4 mg Intravenous Given 09/23/17 1959)  sodium chloride 0.9 % bolus 1,000 mL (0 mLs Intravenous Stopped 09/23/17 2240)     NEW OUTPATIENT MEDICATIONS STARTED DURING THIS VISIT:  Discharge Medication List as of 09/23/2017 10:30 PM    START taking these medications   Details  Doxylamine-Pyridoxine  (DICLEGIS) 10-10 MG TBEC Take 1 tablet by mouth every 8 (eight) hours as needed (nausea)., Starting Fri 09/23/2017, Print        Note:  This document was prepared using Dragon voice recognition software and may include unintentional dictation errors.  Alona Bene, MD Emergency Medicine    Lino Wickliff, Arlyss Repress, MD 09/24/17 [phone]

## 2017-09-23 NOTE — ED Notes (Signed)
PT DISCHARGED. INSTRUCTIONS AND PRESCRIPTION GIVEN. AAOX4. PT IN NO APPARENT DISTRESS OR PAIN. THE OPPORTUNITY TO ASK QUESTIONS WAS PROVIDED. 

## 2017-09-23 NOTE — ED Triage Notes (Signed)
PT BIB BY EMS FROM HOME C/O UNCONTROLLABLE SHAKING, DIZZINESS, N/V SINCE LAST NIGHT. PT STS SHE IS APPROX [redacted] WEEKS PREGNANT, AND WAS TOLD BY HER PSYCHIATRIST TO STOP TAKING LATUDA, PROZAC, AND XANAX SINCE LAST WEEK Thursday. PT STS SHE CALLED HER DOCTOR TODAY ABOUT HER SYMPTOMS, AND STS SHE WAS TOLD TO TAKE 1 TAB OF LATUDA AND PROZAC. SHE STS TAKING THEM MADE HER SYMPTOMS WORSE. PT HAS HER FIRST OB APPT ON TUES OR THURS NEXT WEEK. G5P2. PT STS SHE TOOK XANAX X1 PTA, WHICH HELPED THE SYMPTOMS.

## 2017-09-23 NOTE — ED Triage Notes (Signed)
Pt is presented from home, reportedly stopped taking her psychiatric medication (xanax, prozac and trazadone) abruptly when she found she is pregnant, since then she has been experiencing tremors and not feeling well. Presenting medics report that they received a verbal order from pt's PCP to administer her sublingual xanax on scene prior to transfer to the ED, pt reports feeling slightly better. She also c/o nausea, vomiting and feeling clammy.

## 2017-09-23 NOTE — Discharge Instructions (Signed)
You were seen in the ED today with nausea, tremors, and other symptoms that are likely due to medication withdrawal. You can take your Xanax as needed for these symptoms can discuss continuing these during pregnancy with your OB provider on Tuesday. Take the Diclegis as needed for nausea and/or vomiting. This is a safe medication to take during pregnancy. Return to the ED if your symptoms suddenly worsen or you develop and new or worsening symptoms.

## 2023-09-14 ENCOUNTER — Telehealth: Payer: Self-pay | Admitting: Genetic Counselor

## 2023-09-14 ENCOUNTER — Encounter: Payer: Self-pay | Admitting: Medical Genetics

## 2023-09-14 DIAGNOSIS — Z1509 Genetic susceptibility to other malignant neoplasm: Secondary | ICD-10-CM

## 2023-09-14 NOTE — Telephone Encounter (Signed)
 Spoke with Samantha Jennings regarding the results of her recent genetic testing.   Samantha Jennings son was seen in the [facility] clinic due to a personal history of autism spectrum disorder.  As part of his testing, Samantha Jennings opted into secondary findings including genetic testing for herself for adult onsent genetic conditions.   The 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  Clinical Information: Hereditary breast, ovarian, and pancreatic cancer risk due to pathogenic variants in PALB2 is characterized by an increased lifetime risk for, generally, adult-onset cancers including, breast, contralateral breast, female breast, ovarian, and pancreatic.  The cancers associated with PALB2 are:  Female breast cancer, up to an 53% risk There is a higher incidence of triple negative breast cancer diagnosed in women with PALB2 mutations In women with a history of breast cancer, the cumulative risk for contralateral breast cancer 5 years after breast cancer diagnosis is 5-8%. Female breast cancer, up to a 0.9% risk Ovarian cancer, up to a 3-5% risk Pancreatic cancer, 3-5% risk  Management Recommendations:  Breast Screening/Risk Reduction:  Women: Breast cancer screening includes: Breast awareness beginning at age 46 Monthly self-breast examination beginning at age 46 Clinical breast examination every 6-12 months beginning at age 46 or at the age of the earliest diagnosed breast cancer in the family, if onset was before age 49 Annual breast MRI with contrast beginning at age 46 (or annual mammograms with consideration of tomosynthesis if MRI is unavailable), although the age to initiate screening may be individualized based on family history Annual mammogram beginning at age 46 until age 46 with consideration of tomosynthesis with  continuation of annual breast MRI with contrast The option of prophylactic bilateral risk-reducing mastectomy (RRM), removal of the breast tissue before cancer develops, is the best option for significantly decreasing the risk of developing breast cancer. Studies have shown mastectomies reduce the risk of breast cancer by 90-95% in women with a BRCA1 mutation - and may also be seen in the PALB2 population. Breast reconstruction can also be performed immediately following the mastectomy, depending on the type of reconstruction chosen. For women with a PALB2 pathogenic or likely pathogenic variant who are treated for breast cancer and have not had a bilateral mastectomy, screening with annual mammogram with consideration of tomosynthesis and breast MRI should continue as described above.  Males: Breast self-exam training and education starting at age 49 years Annual clinical breast exam starting at age 46 years  Consider annual mammogram starting at age 66 or 10 years before the earliest known female breast cancer in the family (whichever comes first).   Gynecological Cancer Screening/Risk Reduction: It is recommended that women with a PALB2 mutation consider having a risk-reducing salpingo oophorectomy (RRSO), removal of the ovaries and fallopian tubes, between the ages of 37-50 or once childbearing is completed. Having a RRSO is estimated to reduce the risk of ovarian cancer by up to 96%. There is still a small risk of developing an ovarian-like cancer in the lining of the abdomen, called the peritoneum. Another benefit to having the ovaries removed is the risk reduction for breast cancer. If the ovaries are removed before menopause, the risk of developing breast cancer is reduced. Women undergoing a RRSO should be aware of the potential risks and benefits of concurrent hysterectomy. Hormone replacement therapy could be considered based on the physician's discretion. Individuals at risk for developing  breast and ovarian cancer may benefit from the use of medication to reduce their risk for cancer. These medications are referred to as chemoprevention. For example, oral contraceptive use has been shown to reduce the risk of ovarian cancer by approximately 60% in BRCA1 mutation carriers if taken for at least 5 years. This risk reduction remains even after discontinuation of oral contraceptives. Ovarian cancer screening is an option for women who chose not to have a RRSO or who, as of yet, have not completed their family. Current screening methods for ovarian cancer are neither sensitive nor specific, meaning that often early stage ovarian cancer cannot be diagnosed through this screening.  Screening can also be falsely positive with no cancer present. For this reason, RRSO is recommended over screening. If ovarian cancer screening is recommended by your physician, it could include: CA-125 blood tests Transvaginal ultrasounds Clinical pelvic exams   Pancreatic Cancer Screening/Risk Reduction: Avoid smoking, heavy alcohol use, and obesity. It has been suggested that pancreatic cancer screening be limited to those with a family history of pancreatic cancer (first- or second-degree relative). Ideally, screening should be performed in experienced centers utilizing a multidisciplinary approach under research conditions. Recommended screening could include annual endoscopic ultrasound (preferred) and/or MRI of the pancreas starting at age 25 or 34 years younger than the earliest age diagnosis in the family. CA19-9 testing may be considered based on the physician's discretion.  Additional Considerations: Recent studies have suggested PARP inhibitors may be a beneficial chemotherapeutic agent for a subset of patients with PALB2-associated breast, ovarian, prostate, and pancreatic cancers. Clinical trials are currently in process to determine if and how these agents can be useful in the treatment of PALB2 cancer  patients. Patients of reproductive age should be made aware of options for prenatal diagnosis and assisted reproduction including pre-implantation genetic diagnosis. Individuals with a single pathogenic PALB2 variant are carriers of Fanconi anemia. Fanconi anemia is characterized by developmental delay apparent from infancy, short stature, microcephaly, and coarse dysmorphic features. For there to be a risk of Fanconi anemia in offspring, both the patient and their partner would each have to carry a pathogenic variant in PALB2. In this case, the risk of having an affected child is 25%.   This information is based on current understanding of the gene and may change in the future.  A referral to cancer genetic counseling for detailed discussion and recommendations was offered and accepted.  This referral will be placed following today's phone call.  Implications for Family Members: Hereditary predisposition to cancer due to pathogenic variants in the PALB2 gene has autosomal dominant inheritance. This means that an individual with a pathogenic variant has a 50% chance of passing the condition on to his/her offspring. Identification of a pathogenic variant allows for the recognition of at-risk relatives who can pursue testing for the familial variant.   Family members are encouraged to consider genetic testing for this familial pathogenic variant. As there are generally no childhood cancer risks associated with pathogenic variants in the PALB2 gene, individuals in the family are not recommended to have testing until they reach at least 46 years of age. They may contact our office at [phone] for more information or to schedule an appointment.  Complimentary testing for the familial variant is available for 90 days.  Family members who live outside of the area are encouraged to find a genetic counselor in their area by visiting: [email or web address].  Samantha Jennings plans to discuss  these results with her adult daughter and  sister.  Resources: FORCE (Facing Our Risk of Cancer Empowered) is a resource for those with a hereditary predisposition to develop cancer.  FORCE provides information about risk reduction, advocacy, legislation, and clinical trials.  Additionally, [employer]CE provides a platform for collaboration and support; which includes: peer navigation, message boards, local support groups, a toll-free helpline, research registry and recruitment, advocate training, published medical research, webinars, brochures, mastectomy photos, and more.  For more information, visit www.facingourrisk.org  Samantha Jennings expressed understanding of these results and was encouraged to reach out with any further questions.  The test report has been released to the family and is attached to the associated order.   Kimberly Molt, MS [facility] Certified Genetic Counselor

## 2023-11-17 DIAGNOSIS — Z1379 Encounter for other screening for genetic and chromosomal anomalies: Secondary | ICD-10-CM | POA: Insufficient documentation

## 2023-11-17 DIAGNOSIS — Z1589 Genetic susceptibility to other disease: Secondary | ICD-10-CM | POA: Insufficient documentation

## 2023-11-18 ENCOUNTER — Inpatient Hospital Stay: Attending: Hematology

## 2023-11-18 DIAGNOSIS — Z1379 Encounter for other screening for genetic and chromosomal anomalies: Secondary | ICD-10-CM | POA: Insufficient documentation

## 2023-11-18 DIAGNOSIS — Z8042 Family history of malignant neoplasm of prostate: Secondary | ICD-10-CM | POA: Insufficient documentation

## 2023-11-18 DIAGNOSIS — Z808 Family history of malignant neoplasm of other organs or systems: Secondary | ICD-10-CM | POA: Insufficient documentation

## 2023-11-18 NOTE — Progress Notes (Unsigned)
 REFERRING PROVIDER: Teresa Aldona CROME, NP [phone],  [place] [address]  PRIMARY PROVIDER:  Teresa Aldona CROME, NP  PRIMARY REASON FOR VISIT:  No diagnosis found.  VIRTUAL VISIT  I connected with  Ms. Pete on 11/18/2023 at 1:30PM EDT by phone and verified that I am speaking with the correct person using three identifiers.  Patient location: At home in [facility] Provider location: Cancer Center at [facility]   HISTORY OF PRESENT ILLNESS:   Ms. Goodroe, a 48 y.o. female, was seen for a [place] cancer genetics consultation at the request of Kimberly Molt, [facility] after she was identified to have a PALB2 pathogenic variant identified as a secondary findings result as a part of her sons diagnostic whole exome sequencing ordered through [facility]. Ms. Lichtman presents to clinic today to discuss these result and implications for her care and management.  Previous Genetic Testing: Ms. Chisum son was seen in [facility] due to his personal history of autism spectrum disorder and global developmental delay. Whole genome sequencing was ordered and Ms. Feimster opted in for secondary findings for her son and herself.  The 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  Lab Results Lab: [facility] Test: ExomeNext Result: PALB2 Pathogenic Variant c.2257C>T (p.R753*)     CANCER HISTORY:  Oncology History   No history exists.   RISK FACTORS:  Menarche was at age 77.  First live birth at age 67.  OCP use for approximately 23 years.  Ovaries intact: yes.  Hysterectomy: yes.  Menopausal status: premenopausal.  HRT use: 0 years. Colonoscopy: yes; normal (06/30/2021) Mammogram within the last year: Yes (11/22/2023) Number of breast biopsies: 0. Up to date with pelvic exams: Yes. Any  excessive radiation exposure in the past: no Tobacco Use: Never Past Medical History:  Diagnosis Date   Abnormal Pap smear    ASCUS   Bipolar 1 disorder (HCC)    Thalassemia     Past Surgical History:  Procedure Laterality Date   COLPOSCOPY     CRYOTHERAPY     DILATION AND CURETTAGE OF UTERUS     X2    Social History   Socioeconomic History   Marital status: Married    Spouse name: Not on file   Number of children: Not on file   Years of education: Not on file   Highest education level: Not on file  Occupational History   Not on file  Tobacco Use   Smoking status: Former    Current packs/day: 0.00    Average packs/day: 1 pack/day for 10.0 years (10.0 ttl pk-yrs)    Types: Cigarettes    Start date: 10/12/1993    Quit date: 10/13/2003    Years since quitting: 20.1   Smokeless tobacco: Never   Tobacco comments:       Vaping Use   Vaping status: Never Used  Substance and Sexual Activity   Alcohol use: No   Drug use: No   Sexual activity: Yes    Partners: Male    Birth control/protection: Pill  Other Topics Concern   Not on file  Social History Narrative   Not on file   Social Drivers of Health   Financial Resource Strain: Medium Risk (05/09/2023)   [facility]   Overall Financial Resource Strain (CARDIA)    Difficulty of  Paying Living Expenses: Somewhat hard  Food Insecurity: Food Insecurity Present (05/09/2023)   Received from [facility]   Hunger Vital Sign    Within the past 12 months, you worried that your food would run out before you got the money to buy more.: Sometimes true    Within the past 12 months, the food you bought just didn't last and you didn't have money to get more.: Never true  Transportation Needs: No Transportation Needs (05/09/2023)   Received from [facility] - Transportation    Lack of Transportation (Medical): No    Lack of Transportation (Non-Medical): No  Physical Activity: Unknown (05/09/2023)    Received from [facility]   Exercise Vital Sign    On average, how many days per week do you engage in moderate to strenuous exercise (like a brisk walk)?: Patient declined    On average, how many minutes do you engage in exercise at this level?: 10 min  Stress: Stress Concern Present (05/09/2023)   Received from [facility] - Occupational Stress Questionnaire    Feeling of Stress : Very much  Social Connections: Socially Isolated (05/09/2023)   Received from [facility]   Social Network    How would you rate your social network (family, work, friends)?: Little participation, lonely and socially isolated     FAMILY HISTORY:  We obtained a detailed, 4-generation family history.  Significant diagnoses are listed below: Family History  Problem Relation Age of Onset   Hypertension Mother    Skin cancer Mother    Hypertension Sister    Prostate cancer Maternal Uncle    Suicidality Father    Anemia Father    Autism spectrum disorder Daughter        level 1   Post-traumatic stress disorder Daughter    Autism spectrum disorder Son        level 3   Developmental delay Other     Pedigree Summary: *** Ms. Caudell has a family history of: Mother - Uterine cancer at 83 and skin cancer non-melanoma 4x Maternal Uncles - Prostate Cancer Ms. Meadowcroft is not aware of relatives other than her and her son who have completed genetic testing for hereditary cancer risks.    GENETIC COUNSELING ASSESSMENT: Ms. Lakins is a 48 y.o. female with a known PALB2 disease causing variant. We, therefore, discussed and recommended the following at today's visit.    Clinical Information: Hereditary breast, ovarian, and pancreatic cancer risk due to pathogenic variants in PALB2 is characterized by an increased lifetime risk for, generally, adult-onset cancers including, breast, contralateral breast, female breast, ovarian, and pancreatic.  The cancers associated  with PALB2 are:  Female breast cancer, up to an 53% risk There is a higher incidence of triple negative breast cancer diagnosed in women with PALB2 mutations In women with a history of breast cancer, the cumulative risk for contralateral breast cancer 5 years after breast cancer diagnosis is 5-8%. Female breast cancer, up to a 0.9% risk Ovarian cancer, up to a 3-5% risk Pancreatic cancer, 3-5% risk  Management Recommendations:  Breast Screening/Risk Reduction:  Women: Breast cancer screening includes: Breast awareness beginning at age 19 Monthly self-breast examination beginning at age 2 Clinical breast examination every 6-12 months beginning at age 45 or at the age of the earliest diagnosed breast cancer in the family, if onset was before age 31 Annual breast MRI with contrast beginning at age 49 (or annual mammograms with  consideration of tomosynthesis if MRI is unavailable), although the age to initiate screening may be individualized based on family history Annual mammogram beginning at age 77 until age 74 with consideration of tomosynthesis with continuation of annual breast MRI with contrast The option of prophylactic bilateral risk-reducing mastectomy (RRM), removal of the breast tissue before cancer develops, is the best option for significantly decreasing the risk of developing breast cancer. Studies have shown mastectomies reduce the risk of breast cancer by 90-95% in women with a BRCA1 mutation - and may also be seen in the PALB2 population. Breast reconstruction can also be performed immediately following the mastectomy, depending on the type of reconstruction chosen. For women with a PALB2 pathogenic or likely pathogenic variant who are treated for breast cancer and have not had a bilateral mastectomy, screening with annual mammogram with consideration of tomosynthesis and breast MRI should continue as described above.  Males: Breast self-exam training and education starting at  age 68 years Annual clinical breast exam starting at age 29 years  Consider annual mammogram starting at age 28 or 10 years before the earliest known female breast cancer in the family (whichever comes first).   Gynecological Cancer Screening/Risk Reduction: It is recommended that women with a PALB2 mutation consider having a risk-reducing salpingo oophorectomy (RRSO), removal of the ovaries and fallopian tubes, between the ages of 53-50 or once childbearing is completed. Having a RRSO is estimated to reduce the risk of ovarian cancer by up to 96%. There is still a small risk of developing an ovarian-like cancer in the lining of the abdomen, called the peritoneum. Another benefit to having the ovaries removed is the risk reduction for breast cancer. If the ovaries are removed before menopause, the risk of developing breast cancer is reduced. Women undergoing a RRSO should be aware of the potential risks and benefits of concurrent hysterectomy. Hormone replacement therapy could be considered based on the physician's discretion. Individuals at risk for developing breast and ovarian cancer may benefit from the use of medication to reduce their risk for cancer. These medications are referred to as chemoprevention. For example, oral contraceptive use has been shown to reduce the risk of ovarian cancer by approximately 60% in BRCA1 mutation carriers if taken for at least 5 years. This risk reduction remains even after discontinuation of oral contraceptives. Ovarian cancer screening is an option for women who chose not to have a RRSO or who, as of yet, have not completed their family. Current screening methods for ovarian cancer are neither sensitive nor specific, meaning that often early stage ovarian cancer cannot be diagnosed through this screening.  Screening can also be falsely positive with no cancer present. For this reason, RRSO is recommended over screening. If ovarian cancer screening is recommended by  your physician, it could include: CA-125 blood tests Transvaginal ultrasounds Clinical pelvic exams   Pancreatic Cancer Screening/Risk Reduction: Avoid smoking, heavy alcohol use, and obesity. It has been suggested that pancreatic cancer screening be limited to those with a family history of pancreatic cancer (first- or second-degree relative). Ideally, screening should be performed in experienced centers utilizing a multidisciplinary approach under research conditions. Recommended screening could include annual endoscopic ultrasound (preferred) and/or MRI of the pancreas starting at age 36 or 60 years younger than the earliest age diagnosis in the family. CA19-9 testing may be considered based on the physician's discretion.  Additional Considerations: Recent studies have suggested PARP inhibitors may be a beneficial chemotherapeutic agent for a subset of patients with PALB2-associated  breast, ovarian, prostate, and pancreatic cancers. Clinical trials are currently in process to determine if and how these agents can be useful in the treatment of PALB2 cancer patients. Patients of reproductive age should be made aware of options for prenatal diagnosis and assisted reproduction including pre-implantation genetic diagnosis. Individuals with a single pathogenic PALB2 variant are carriers of Fanconi anemia. Fanconi anemia is characterized by developmental delay apparent from infancy, short stature, microcephaly, and coarse dysmorphic features. For there to be a risk of Fanconi anemia in offspring, both the patient and their partner would each have to carry a pathogenic variant in PALB2. In this case, the risk of having an affected child is 25%.    This information is based on current understanding of the gene and may change in the future.  Implications for Family Members: Hereditary predisposition to cancer due to pathogenic variants in the PALB2 gene has autosomal dominant inheritance. This means that  an individual with a pathogenic variant has a 50% chance of passing the condition on to his/her offspring. Identification of a pathogenic variant allows for the recognition of at-risk relatives who can pursue testing for the familial variant.  Family members are encouraged to consider genetic testing for this familial pathogenic variant. As there are generally no childhood cancer risks associated with pathogenic variants in the PALB2 gene, individuals in the family are not recommended to have testing until they reach at least 46 years of age. They may contact our office at [phone] for more information or to schedule an appointment.  Complimentary testing for the familial variant is available for 90 days.  Family members who live outside of the area are encouraged to find a genetic counselor in their area by visiting: [email or web address].  We encouraged Ms. Speyer to remain in contact with us  on an annual basis so we can update her personal and family histories, and let her know of advances in cancer genetics that may benefit the family. Our contact number was provided. Ms. Folkes questions were answered to her satisfaction today, and she knows she is welcome to call anytime with additional questions.   Ms. Rockers questions were answered to her satisfaction today. Our contact information was provided should additional questions or concerns arise. Thank you for the referral and allowing us  to share in the care of your patient.   ***Resources:  Ms. Heeren was provided with the following:  [facility] PALB2 cancer management [facility] PALB2 Information about who to contact to update medical records Tables comparing risks for BRCA1/2 and PALB2 Send a copy of note to PCP  PLAN:  Referral to High Risk [facility]  Santana Fryer, MS, [facility]  Certified Genetic Counselor  Email: [email or web address]  Phone: [phone]   In total, 35 minutes were spent on the  date of the encounter in service to the patient including preparation, face-to-face consultation, documentation and care coordination.  The patient was seen alone via phone visit. Drs. Lanny Stalls, and/or Gudena were available for questions, if needed. _______________________________________________________________________ For Office Staff:  Number of people involved in session: 1 Was an Intern/ student involved with case: no
